# Patient Record
Sex: Male | Born: 1960
Health system: Southern US, Community
[De-identification: ages and names within clinical notes are randomized; demographics above are authoritative.]

## PROBLEM LIST (undated history)

## (undated) DIAGNOSIS — Z87448 Personal history of other diseases of urinary system: Secondary | ICD-10-CM

## (undated) DIAGNOSIS — K4090 Unilateral inguinal hernia, without obstruction or gangrene, not specified as recurrent: Secondary | ICD-10-CM

## (undated) DIAGNOSIS — E7800 Pure hypercholesterolemia, unspecified: Secondary | ICD-10-CM

## (undated) HISTORY — DX: Pure hypercholesterolemia, unspecified: E78.00

## (undated) HISTORY — DX: Personal history of other diseases of urinary system: Z87.448

## (undated) HISTORY — DX: Unilateral inguinal hernia, without obstruction or gangrene, not specified as recurrent: K40.90

---

## 1984-09-03 DIAGNOSIS — Z87448 Personal history of other diseases of urinary system: Secondary | ICD-10-CM

## 1984-09-03 HISTORY — DX: Personal history of other diseases of urinary system: Z87.448

## 2005-04-25 ENCOUNTER — Encounter: Admission: RE | Admit: 2005-04-25 | Discharge: 2005-04-25 | Payer: Self-pay | Admitting: General Surgery

## 2005-04-30 ENCOUNTER — Ambulatory Visit (HOSPITAL_BASED_OUTPATIENT_CLINIC_OR_DEPARTMENT_OTHER): Admission: RE | Admit: 2005-04-30 | Discharge: 2005-04-30 | Payer: Self-pay | Admitting: General Surgery

## 2005-04-30 ENCOUNTER — Ambulatory Visit (HOSPITAL_COMMUNITY): Admission: RE | Admit: 2005-04-30 | Discharge: 2005-04-30 | Payer: Self-pay | Admitting: General Surgery

## 2005-09-03 HISTORY — PX: LEG SKIN LESION  BIOPSY / EXCISION: SUR473

## 2012-09-03 DIAGNOSIS — K4090 Unilateral inguinal hernia, without obstruction or gangrene, not specified as recurrent: Secondary | ICD-10-CM

## 2012-09-03 HISTORY — DX: Unilateral inguinal hernia, without obstruction or gangrene, not specified as recurrent: K40.90

## 2012-11-21 ENCOUNTER — Encounter (INDEPENDENT_AMBULATORY_CARE_PROVIDER_SITE_OTHER): Payer: Self-pay | Admitting: General Surgery

## 2012-11-21 ENCOUNTER — Ambulatory Visit (INDEPENDENT_AMBULATORY_CARE_PROVIDER_SITE_OTHER): Payer: 59 | Admitting: General Surgery

## 2012-11-21 VITALS — BP 122/78 | HR 76 | Temp 97.3°F | Resp 16 | Ht 71.0 in | Wt 227.6 lb

## 2012-11-21 DIAGNOSIS — K4090 Unilateral inguinal hernia, without obstruction or gangrene, not specified as recurrent: Secondary | ICD-10-CM

## 2012-11-21 NOTE — Progress Notes (Signed)
Subjective:   hernia right groin  Patient ID: Jeremy Zimmerman, male   DOB: 01-31-1961, 52 y.o.   MRN: [id number]  HPI The patient is here for the courtesy of Dr. Tenny Craw for evaluation of a probable right inguinal hernia. The patient states he recently presented for routine physical exam and was found to have a right inguinal hernia. Since being told it was there he thought he might have had some pressure or mild discomfort in the area but thinks it may have just been related to worrying about it. He certainly has had no significant pain or associated GI symptoms. He has no previous history of hernia repairs. Past Medical History  Diagnosis Date  . Inguinal hernia without mention of obstruction or gangrene, unilateral or unspecified, (not specified as recurrent) 2014  . Pure hypercholesterolemia   . History of kidney problems 1986    Patient has history of non-specific kidney problems   Past Surgical History  Procedure Laterality Date  . Leg skin lesion  biopsy / excision  2007    Growth removed    No current outpatient prescriptions on file.   No current facility-administered medications for this visit.   History  Substance Use Topics  . Smoking status: Current Some Day Smoker    Types: Cigars  . Smokeless tobacco: Never Used  . Alcohol Use: 0.6 oz/week    1 Cans of beer per week     Review of Systems  Respiratory: Negative.   Cardiovascular: Negative.   Gastrointestinal: Negative.   Genitourinary: Negative.        Objective:   Physical Exam BP 122/78  Pulse 76  Temp(Src) 97.3 F (36.3 C) (Temporal)  Resp 16  Ht 5\' 11"  (1.803 m)  Wt 227 lb 9.6 oz (103.239 kg)  BMI 31.76 kg/m2 General: Mildly overweight but otherwise well-appearing African American male Skin: No rash infection HEENT: No palpable masses or thyromegaly. Sclera nonicteric. Lungs: Clear equal breath sounds bilaterally Cardiac: Regular rate and rhythm without murmur. No edema. Abdomen: Generally soft and  nontender. There is a moderate-sized probably direct right inguinal hernia. I cannot feel any hernia on the left. GU normal male without masses Extremities: No joint swelling or edema Neurologic: Alert and fully oriented. Gait normal.    Assessment:     Right inguinal hernia. This is minimally symptomatic. We discussed the nature of hernia and options for repair. We discussed laparoscopic repair including the indications and risks of general anesthesia, bleeding, infection, recurrence, chronic pain and possible need for conversion to an open procedure.. We discussed that with minimal symptoms I think he is options for watchful waiting versus going ahead with repair. After our discussion and after all his questions were answered he feels he would like to have this repaired in the summer.he will call us back in regards to scheduling date appear    Plan:     Laparoscopic repair of right inguinal hernia as an outpatient under general anesthesia.

## 2013-02-20 ENCOUNTER — Ambulatory Visit (INDEPENDENT_AMBULATORY_CARE_PROVIDER_SITE_OTHER): Payer: PRIVATE HEALTH INSURANCE | Admitting: General Surgery

## 2013-02-20 ENCOUNTER — Encounter (INDEPENDENT_AMBULATORY_CARE_PROVIDER_SITE_OTHER): Payer: Self-pay | Admitting: General Surgery

## 2013-02-20 VITALS — BP 124/72 | HR 70 | Temp 97.1°F | Resp 16 | Ht 71.0 in | Wt 222.6 lb

## 2013-02-20 DIAGNOSIS — K4090 Unilateral inguinal hernia, without obstruction or gangrene, not specified as recurrent: Secondary | ICD-10-CM

## 2013-02-20 NOTE — Patient Instructions (Signed)

## 2013-02-20 NOTE — Progress Notes (Signed)
Subjective:   hernia right groin  Patient ID: Jeremy Zimmerman, male   DOB: 1961/04/03, 52 y.o.   MRN: [id number]  HPI The patient was previously referred by Dr. Tenny Craw for evaluation of a probable right inguinal hernia. The patient had presented for routine physical exam and was found to have a right inguinal hernia. Since being told it was there he thought he might have had some pressure or mild discomfort in the area but thinks it may have just been related to worrying about it. He certainly has had no significant pain or associated GI symptoms. He has no previous history of hernia repairs. He was evaluated several months ago and wanted to put off for surgery for a little while. He returns today ready to get it repaired. He has occasional mild discomfort but no real change in his symptoms.  Past Medical History  Diagnosis Date  . Inguinal hernia without mention of obstruction or gangrene, unilateral or unspecified, (not specified as recurrent) 2014  . Pure hypercholesterolemia   . History of kidney problems 1986    Patient has history of non-specific kidney problems   Past Surgical History  Procedure Laterality Date  . Leg skin lesion  biopsy / excision  2007    Growth removed    Current Outpatient Prescriptions  Medication Sig Dispense Refill  . IBUPROFEN PO Take by mouth.       No current facility-administered medications for this visit.   History  Substance Use Topics  . Smoking status: Current Some Day Smoker    Types: Cigars  . Smokeless tobacco: Never Used  . Alcohol Use: 0.6 oz/week    1 Cans of beer per week     Review of Systems  Respiratory: Negative.   Cardiovascular: Negative.   Gastrointestinal: Negative.   Genitourinary: Negative.        Objective:   Physical Exam BP 124/72  Pulse 70  Temp(Src) 97.1 F (36.2 C) (Temporal)  Resp 16  Ht 5\' 11"  (1.803 m)  Wt 222 lb 9.6 oz (100.971 kg)  BMI 31.06 kg/m2 General: Mildly overweight but otherwise well-appearing  African American male Skin: No rash infection HEENT: No palpable masses or thyromegaly. Sclera nonicteric. Lungs: Clear equal breath sounds bilaterally Cardiac: Regular rate and rhythm without murmur. No edema. Abdomen: Generally soft and nontender. There is a moderate-sized probably direct right inguinal hernia. I cannot feel any hernia on the left. GU normal male without masses Extremities: No joint swelling or edema Neurologic: Alert and fully oriented. Gait normal.    Assessment:     Right inguinal hernia. This is minimally symptomatic. We discussed the nature of hernia and options for repair. We discussed laparoscopic repair including the indications and risks of general anesthesia, bleeding, infection, recurrence, chronic pain and possible need for conversion to an open procedure.Marland Kitchen He would like to go ahead and get this repaired which I think is the best course.     Plan:     Laparoscopic repair of right inguinal hernia as an outpatient under general anesthesia.

## 2013-03-03 ENCOUNTER — Telehealth (INDEPENDENT_AMBULATORY_CARE_PROVIDER_SITE_OTHER): Payer: Self-pay | Admitting: *Deleted

## 2013-03-03 NOTE — Telephone Encounter (Signed)
Patient called to ask about his Celebrex prior to surgery.  Instructed patient to stop 7 days prior to his surgery date 03/17/13.  Patient agreeable and states understanding.

## 2013-03-17 DIAGNOSIS — K4090 Unilateral inguinal hernia, without obstruction or gangrene, not specified as recurrent: Secondary | ICD-10-CM

## 2013-04-08 ENCOUNTER — Encounter (INDEPENDENT_AMBULATORY_CARE_PROVIDER_SITE_OTHER): Payer: Self-pay | Admitting: General Surgery

## 2013-04-08 ENCOUNTER — Ambulatory Visit (INDEPENDENT_AMBULATORY_CARE_PROVIDER_SITE_OTHER): Payer: PRIVATE HEALTH INSURANCE | Admitting: General Surgery

## 2013-04-08 DIAGNOSIS — Z09 Encounter for follow-up examination after completed treatment for conditions other than malignant neoplasm: Secondary | ICD-10-CM

## 2013-04-08 NOTE — Progress Notes (Signed)
History: Patient returns 3 weeks following laparoscopic repair of his right inguinal hernia. He reports he is doing well. Just minimal soreness at his umbilical incision. No other complaints.  Exam: There were no vitals taken for this visit. General: Appears well Abdomen: Incisions all well healed. There is a small palpable seroma at his right groin but no evidence of recurrent hernia with coughing and straining and Valsalva maneuver.  Assessment and plan: Doing well without complication. He has a small seroma but told him should resolve and he did not even noticed it. He is cleared to return to work next week and return here as needed.

## 2013-06-18 ENCOUNTER — Encounter: Payer: Self-pay | Admitting: Interventional Cardiology

## 2013-06-23 ENCOUNTER — Ambulatory Visit (INDEPENDENT_AMBULATORY_CARE_PROVIDER_SITE_OTHER): Payer: Managed Care, Other (non HMO) | Admitting: Interventional Cardiology

## 2013-06-23 ENCOUNTER — Encounter: Payer: Self-pay | Admitting: Interventional Cardiology

## 2013-06-23 VITALS — BP 140/75 | HR 61 | Ht 71.0 in | Wt 227.0 lb

## 2013-06-23 DIAGNOSIS — E785 Hyperlipidemia, unspecified: Secondary | ICD-10-CM

## 2013-06-23 DIAGNOSIS — R002 Palpitations: Secondary | ICD-10-CM | POA: Insufficient documentation

## 2013-06-23 DIAGNOSIS — R0789 Other chest pain: Secondary | ICD-10-CM

## 2013-06-23 NOTE — Patient Instructions (Signed)
Your physician has recommended that you wear a holter monitor. Holter monitors are medical devices that record the heart's electrical activity. Doctors most often use these monitors to diagnose arrhythmias. Arrhythmias are problems with the speed or rhythm of the heartbeat. The monitor is a small, portable device. You can wear one while you do your normal daily activities. This is usually used to diagnose what is causing palpitations/syncope (passing out).  Your physician has requested that you have an exercise tolerance test. For further information please visit [email or web address] Please also follow instruction sheet, as given.

## 2013-06-23 NOTE — Progress Notes (Signed)
Patient ID: Jeremy Zimmerman, male   DOB: 15-Aug-1961, 52 y.o.   MRN: [id number]   Date: 06/23/2013 ID: Jeremy Zimmerman, DOB [birth year], MRN [id number] PCP:  Duane Lope, MD  Reason: Chest discomfort  ASSESSMENT;  1. Chest tightness occurring in episodes lasting up to 15 minutes.  2. Palpitations  3. Hyperlipidemia   PLAN:  1. Exercise treadmill test to exclude coronary artery disease  2. 24-48 hour Holter monitor to exclude arrhythmia   SUBJECTIVE: Jeremy Zimmerman is a 52 y.o. male who is very pleasant gentleman who exercises frequently. He rarely smokes cigarettes. There is a 3 year history of poorly characterized chest discomfort in the lower mid sternal area that radiates to the back. These episodes start in the epigastric area and rise up into his chest. He also gets the sense that there is pounding or irregularity of the heartbeat. The longest episode has been 15 minutes. These episodes are unpredictable. They are nonexertional. The long episode caused him to feel diaphoretic and weak. He denies dyspnea. He has never had syncope. He has had approximately 10 episodes over 3 years.   No Known Allergies  Current Outpatient Prescriptions on File Prior to Visit  Medication Sig Dispense Refill  . IBUPROFEN PO Take by mouth.      . oxyCODONE-acetaminophen (PERCOCET/ROXICET) 5-325 MG per tablet        No current facility-administered medications on file prior to visit.    Past Medical History  Diagnosis Date  . Inguinal hernia without mention of obstruction or gangrene, unilateral or unspecified, (not specified as recurrent) 2014  . Pure hypercholesterolemia   . History of kidney problems 1986    Patient has history of non-specific kidney problems    Past Surgical History  Procedure Laterality Date  . Leg skin lesion  biopsy / excision  2007    Growth removed     History   Social History  . Marital Status: Married    Spouse Name: N/A    Number of Children: N/A  . Years of  Education: N/A   Occupational History  . Not on file.   Social History Main Topics  . Smoking status: Current Some Day Smoker    Types: Cigars  . Smokeless tobacco: Never Used  . Alcohol Use: 0.6 oz/week    1 Cans of beer per week  . Drug Use: No  . Sexual Activity: Not on file   Other Topics Concern  . Not on file   Social History Narrative  . No narrative on file    Family History  Problem Relation Age of Onset  . Hypertension Mother     ROS: Nausea, vomiting, syncope, tachycardia palpitations, transient neurological symptoms, edema, abdominal pain, wheezing, hemoptysis, weight loss, and anorexia. Other systems negative for complaints.  OBJECTIVE: BP 140/75  Pulse 61  Ht 5\' 11"  (1.803 m)  Wt 227 lb (102.967 kg)  BMI 31.67 kg/m2,  General: No acute distress, well-developed, well-nourished, in excellent physical condition HEENT: normal  Neck: JVD none. Carotids with no bruits and 2+ upstroke Chest: Clear Cardiac: Murmur: None. Gallop: S4. Rhythm: Regular. Other: None Abdomen: Bruit: None. Pulsation: Normal Extremities: Edema: None. Pulses: 2+ and symmetric bilaterally Neuro: Normal Psych: Normal  ECG: Normal

## 2013-07-08 ENCOUNTER — Encounter (INDEPENDENT_AMBULATORY_CARE_PROVIDER_SITE_OTHER): Payer: Managed Care, Other (non HMO)

## 2013-07-08 ENCOUNTER — Encounter: Payer: Self-pay | Admitting: *Deleted

## 2013-07-08 DIAGNOSIS — R002 Palpitations: Secondary | ICD-10-CM

## 2013-07-08 NOTE — Progress Notes (Signed)
Patient ID: Jeremy Zimmerman, male   DOB: [birth year], 52 y.o.   MRN: [id number] EVO 48 hour holter monitor applied to patient.

## 2013-07-27 ENCOUNTER — Telehealth: Payer: Self-pay

## 2013-07-27 NOTE — Telephone Encounter (Signed)
Pt given results of holter monitor.normal study.pt verbalized understanding.

## 2013-07-28 ENCOUNTER — Ambulatory Visit (INDEPENDENT_AMBULATORY_CARE_PROVIDER_SITE_OTHER): Payer: Managed Care, Other (non HMO) | Admitting: Interventional Cardiology

## 2013-07-28 DIAGNOSIS — R0789 Other chest pain: Secondary | ICD-10-CM

## 2013-07-28 NOTE — Progress Notes (Signed)
Exercise Treadmill Test  Pre-Exercise Testing Evaluation Rhythm: normal sinus  Rate: 78 bpm     Test  Exercise Tolerance Test Ordering MD: Verdis Prime, MD  Interpreting MD: Verdis Prime, MD  Unique Test No: 1  Treadmill:  1  Indication for ETT: chest tightness  Contraindication to ETT: No   Stress Modality: exercise - treadmill  Cardiac Imaging Performed: non   Protocol: standard Bruce - maximal  Max BP:  165/47   Max MPHR (bpm):  167 85% MPR (bpm):  142  MPHR obtained (bpm):  146  % MPHR obtained:  87%   Reached 85% MPHR (min:sec):  8 minutes  Total Exercise Time (min-sec):  9 minutes   Workload in METS: 10.4  Borg Scale: 17   Reason ETT Terminated:  Surpassed the target heart rate    ST Segment Analysis At Rest: normal ST segments - no evidence of significant ST depression With Exercise: no evidence of significant ST depression  Other Information Arrhythmia:  No Angina during ETT:  absent (0) Quality of ETT:  diagnostic  ETT Interpretation:  normal - no evidence of ischemia by ST analysis  Comments: The patient has better than average exertional tolerance and no evidence of ischemia.  Recommendations: No limitations. He should call if palpitations increase and become disruptive in his lifestyle.

## 2013-12-30 ENCOUNTER — Encounter: Payer: Self-pay | Admitting: Cardiovascular Disease

## 2018-09-08 DIAGNOSIS — I10 Essential (primary) hypertension: Secondary | ICD-10-CM | POA: Diagnosis not present

## 2018-09-08 DIAGNOSIS — T148XXA Other injury of unspecified body region, initial encounter: Secondary | ICD-10-CM | POA: Diagnosis not present

## 2018-09-12 DIAGNOSIS — E7800 Pure hypercholesterolemia, unspecified: Secondary | ICD-10-CM | POA: Diagnosis not present

## 2018-09-16 DIAGNOSIS — R030 Elevated blood-pressure reading, without diagnosis of hypertension: Secondary | ICD-10-CM | POA: Diagnosis not present

## 2018-11-14 DIAGNOSIS — Z0000 Encounter for general adult medical examination without abnormal findings: Secondary | ICD-10-CM | POA: Diagnosis not present

## 2018-12-24 DIAGNOSIS — M549 Dorsalgia, unspecified: Secondary | ICD-10-CM | POA: Diagnosis not present

## 2019-02-23 DIAGNOSIS — M545 Low back pain: Secondary | ICD-10-CM | POA: Diagnosis not present

## 2019-02-23 DIAGNOSIS — M5430 Sciatica, unspecified side: Secondary | ICD-10-CM | POA: Diagnosis not present

## 2019-03-02 DIAGNOSIS — M79659 Pain in unspecified thigh: Secondary | ICD-10-CM | POA: Diagnosis not present

## 2019-03-09 ENCOUNTER — Other Ambulatory Visit: Payer: Self-pay | Admitting: Family Medicine

## 2019-03-09 DIAGNOSIS — M5416 Radiculopathy, lumbar region: Secondary | ICD-10-CM | POA: Diagnosis not present

## 2019-03-09 DIAGNOSIS — M549 Dorsalgia, unspecified: Secondary | ICD-10-CM | POA: Diagnosis not present

## 2019-03-15 ENCOUNTER — Ambulatory Visit (INDEPENDENT_AMBULATORY_CARE_PROVIDER_SITE_OTHER): Payer: BC Managed Care – PPO

## 2019-03-15 ENCOUNTER — Other Ambulatory Visit: Payer: Self-pay

## 2019-03-15 DIAGNOSIS — M5416 Radiculopathy, lumbar region: Secondary | ICD-10-CM | POA: Diagnosis not present

## 2019-03-15 DIAGNOSIS — M545 Low back pain: Secondary | ICD-10-CM | POA: Diagnosis not present

## 2019-03-26 DIAGNOSIS — M5126 Other intervertebral disc displacement, lumbar region: Secondary | ICD-10-CM | POA: Diagnosis not present

## 2019-03-26 DIAGNOSIS — M545 Low back pain: Secondary | ICD-10-CM | POA: Diagnosis not present

## 2019-03-31 ENCOUNTER — Ambulatory Visit: Payer: BC Managed Care – PPO | Admitting: Physical Therapy

## 2019-07-01 DIAGNOSIS — M549 Dorsalgia, unspecified: Secondary | ICD-10-CM | POA: Diagnosis not present

## 2019-07-03 DIAGNOSIS — Z23 Encounter for immunization: Secondary | ICD-10-CM | POA: Diagnosis not present

## 2019-07-08 ENCOUNTER — Encounter: Payer: Self-pay | Admitting: Physical Therapy

## 2019-07-08 ENCOUNTER — Other Ambulatory Visit: Payer: Self-pay

## 2019-07-08 ENCOUNTER — Ambulatory Visit: Payer: BC Managed Care – PPO | Attending: Neurosurgery | Admitting: Physical Therapy

## 2019-07-08 DIAGNOSIS — M62830 Muscle spasm of back: Secondary | ICD-10-CM | POA: Diagnosis not present

## 2019-07-08 DIAGNOSIS — M5442 Lumbago with sciatica, left side: Secondary | ICD-10-CM

## 2019-07-08 NOTE — Therapy (Signed)
[facility] [address] [place], [place], [address] Phone: [phone]   Fax:  [phone]  Physical Therapy Evaluation  Patient Details  Name: Jeremy Zimmerman MRN: [id number] Date of Birth: August 20, 1961 Referring Provider (PT): Daiva Eves Date: 07/08/2019  PT End of Session - 07/08/19 1743    Visit Number  1    Date for PT Re-Evaluation  09/07/19    Authorization Type  [employer]    PT Start Time  1652    PT Stop Time  1740    PT Time Calculation (min)  48 min    Activity Tolerance  Patient tolerated treatment well    Behavior During Therapy  [facility] for tasks assessed/performed       Past Medical History:  Diagnosis Date  . History of kidney problems 1986   Patient has history of non-specific kidney problems  . Inguinal hernia without mention of obstruction or gangrene, unilateral or unspecified, (not specified as recurrent) 2014  . Pure hypercholesterolemia     Past Surgical History:  Procedure Laterality Date  . LEG SKIN LESION  BIOPSY / EXCISION  2007   Growth removed     There were no vitals filed for this visit.   Subjective Assessment - 07/08/19 1654    Subjective  Patient reports that in June he started having worse pain in the low back, he reports he has had pain previously but would normally go away.  That day he started having severe left anterior leg pain.  MRI showed HNP with disc impingement at the left L3-4 area.  His job is unloading pallets, he reports that he has not had any leg pain.The leg pain has gone away but he is still having back pain    Limitations  Lifting;Standing;Walking    Patient Stated Goals  have no pain, do job without difficulty    Currently in Pain?  Yes    Pain Score  5     Pain Location  Back    Pain Orientation  Lower    Pain Descriptors / Indicators  Tightness;Spasm;Aching    Pain Type  Acute pain    Pain Radiating Towards  has had pain in the left leg    Pain Onset   More than a month ago    Pain Frequency  Intermittent    Aggravating Factors   lifting, bending but mostly unsure of why, pain can be up to 8/10    Pain Relieving Factors  takes pain meds, one a day and he usually has pain a 2/10    Effect of Pain on Daily Activities  limits ADL's and movements due to pain         [facility] PT Assessment - 07/08/19 0001      Assessment   Medical Diagnosis  LBP with HNP    Referring Provider (PT)  Lovell Sheehan    Onset Date/Surgical Date  02/05/19    Prior Therapy  no      Precautions   Precautions  None      Balance Screen   Has the patient fallen in the past 6 months  No    Has the patient had a decrease in activity level because of a fear of falling?   No    Is the patient reluctant to leave their home because of a fear of falling?   No      Home Environment   Additional Comments  does yardwork  Prior Function   Level of Independence  Independent    Vocation  Full time employment    Vocation Requirements  normal job was unloading pallets lifting 40# and pulling and pushing, now he is doing more bending and smaller lifting    Leisure  was working out 4 days per week      Posture/Postural Control   Posture Comments  decreased lordosis, slouched posture      ROM / Strength   AROM / PROM / Strength  AROM;Strength      AROM   Overall AROM Comments  Lumbar ROM decreased 25% with c/o tightness      Strength   Overall Strength Comments  slight weakness in the left hip flexor compared to the right, left hip flexion 4+/5      Flexibility   Soft Tissue Assessment /Muscle Length  yes    Hamstrings  tight    Quadriceps  tight    ITB  tight    Piriformis  tight      Palpation   Palpation comment  has knots palpable, tight and some tenderness in the low back and into the buttocks      Special Tests   Other special tests  manual pelvic traction felt good                Objective measurements completed on examination: See above  findings.              PT Education - 07/08/19 1742    Education Details  HEP included standing extension, prone on elbows, sheet traction, HS and piriformis stretches    Person(s) Educated  Patient    Methods  Explanation;Demonstration;Tactile cues;Verbal cues;Handout    Comprehension  Verbalized understanding       PT Short Term Goals - 07/08/19 1747      PT SHORT TERM GOAL #1   Title  independent with initial HEP    Time  2    Period  Weeks    Status  New        PT Long Term Goals - 07/08/19 1747      PT LONG TERM GOAL #1   Title  understand posture and body mechanics    Time  8    Period  Weeks    Status  New      PT LONG TERM GOAL #2   Title  safe with return to a gym    Time  8    Period  Weeks    Status  New      PT LONG TERM GOAL #3   Title  decrease pain without pain meds 50%    Time  8    Period  Weeks    Status  New             Plan - 07/08/19 1744    Clinical Impression Statement  Patient reports that he has had some back pain at times from a fall years ago, he reports that in June he had increase of LBP this time with severe leg pain and causing him to have difficulty walking, MRI showed HNP with left L3-4 nerve impingement, he however report that the leg pain has stopped, he stiill has back pain and reports taking one pain pill a day.  Reports that pain comes and goes and theat there is not true cause. He is very tight  in the LE's, and needs education about posture and body mechanics  Stability/Clinical Decision Making  Stable/Uncomplicated    Clinical Decision Making  Low    Rehab Potential  Good    PT Frequency  1x / week    PT Duration  8 weeks    PT Treatment/Interventions  ADLs/Self Care Home Management;Electrical Stimulation;Moist Heat;Traction;Ultrasound;Manual techniques;Therapeutic exercise;Therapeutic activities;Patient/family education    PT Next Visit Plan  patient is to try HEP on his own, he may return after a week,  would then start education about body mechanics for his job and for gym, if he is in pain will treat pain    Consulted and Agree with Plan of Care  Patient       Patient will benefit from skilled therapeutic intervention in order to improve the following deficits and impairments:  Improper body mechanics, Pain, Postural dysfunction, Increased muscle spasms, Decreased range of motion, Decreased strength, Impaired flexibility  Visit Diagnosis: Acute bilateral low back pain with left-sided sciatica - Plan: PT plan of care cert/re-cert  Muscle spasm of back - Plan: PT plan of care cert/re-cert     Problem List Patient Active Problem List   Diagnosis Date Noted  . Chest tightness 06/23/2013    Class: Acute  . Other and unspecified hyperlipidemia 06/23/2013    Class: Chronic  . Palpitations 06/23/2013    Class: Chronic    Jearld LeschALBRIGHT,MICHAEL W., PT 07/08/2019, 5:50 PM  [facility] [address] [place], [place], [address] Phone: [phone]   Fax:  [phone]  Name: Jeremy Zimmerman MRN: [id number] Date of Birth: 1960-10-13

## 2019-07-28 ENCOUNTER — Ambulatory Visit: Payer: BC Managed Care – PPO | Admitting: Physical Therapy

## 2019-11-13 DIAGNOSIS — S29011A Strain of muscle and tendon of front wall of thorax, initial encounter: Secondary | ICD-10-CM | POA: Diagnosis not present

## 2019-12-16 DIAGNOSIS — Z125 Encounter for screening for malignant neoplasm of prostate: Secondary | ICD-10-CM | POA: Diagnosis not present

## 2019-12-16 DIAGNOSIS — Z13220 Encounter for screening for lipoid disorders: Secondary | ICD-10-CM | POA: Diagnosis not present

## 2019-12-16 DIAGNOSIS — H6121 Impacted cerumen, right ear: Secondary | ICD-10-CM | POA: Diagnosis not present

## 2019-12-16 DIAGNOSIS — Z1159 Encounter for screening for other viral diseases: Secondary | ICD-10-CM | POA: Diagnosis not present

## 2019-12-16 DIAGNOSIS — Z0000 Encounter for general adult medical examination without abnormal findings: Secondary | ICD-10-CM | POA: Diagnosis not present

## 2020-01-19 DIAGNOSIS — R079 Chest pain, unspecified: Secondary | ICD-10-CM | POA: Diagnosis not present

## 2020-01-19 DIAGNOSIS — R946 Abnormal results of thyroid function studies: Secondary | ICD-10-CM | POA: Diagnosis not present

## 2020-01-19 DIAGNOSIS — M549 Dorsalgia, unspecified: Secondary | ICD-10-CM | POA: Diagnosis not present

## 2020-01-20 ENCOUNTER — Other Ambulatory Visit: Payer: Self-pay | Admitting: Family Medicine

## 2020-01-20 DIAGNOSIS — R946 Abnormal results of thyroid function studies: Secondary | ICD-10-CM

## 2020-01-28 NOTE — Progress Notes (Signed)
Cardiology Office Note:   Date:  01/29/2020  NAME:  Jeremy Zimmerman    MRN: [id number] DOB:  1961/02/17   PCP:  Jeremy Cruel, MD  Cardiologist:  No primary care provider on file.   Referring MD: Jeremy Cruel, MD   Chief Complaint  Patient presents with  . Chest Pain   History of Present Illness:   Jeremy Zimmerman is a 59 y.o. male with a hx of HLD who is being seen today for the evaluation of chest pain at the request of Jeremy Cruel, MD.  He presents for evaluation of chest pain.  He reports he got his second Covid shot on December 28, 2019.  Since that time he had intermittent episodes of neck pain as well as headache.  He is also what he describes as a discomfort in his chest.  He reports it feels like dull achy pain.  He reports at its worst 6 out of 10.  He has had 3-4 episodes over the past 1 month.  They can occur at any time.  They last 10 to 15 minutes.  Associated symptoms include shortness of breath.  It does bother him as he is concerned about heart disease runs in the family.  His brother had a heart attack as well as his grandmother.  His CVD risk factors include obesity.  His BMI is 34.  He does not have high blood pressure.  Cholesterol reviewed below.  It is marginal.  He does not perform aerobic exercise routinely but does do weightlifting.  He reports he can complete 20 to 30 minutes without any major limitations such as chest pain or shortness of breath.  He is a former smoker.  He does smoke cigars infrequently.  He also reports some marijuana use.  He rarely drinks alcohol.  Of note, he does report that naproxen has helped.  He has taken this intermittently and the symptoms have resolved.  His primary care physician believes this is related to the Covid vaccine.  He does report in the past that he has had episodes of chest pain that was evaluated in 2015 by Dr. Pernell Zimmerman.  He did undergo an exercise treadmill stress test that appears to have been normal.  His EKG  today in office shows normal sinus rhythm with no acute ischemic changes or evidence of prior infarction.  Labs from primary care physician demonstrate total cholesterol 206, triglycerides 58, HDL 69, LDL 126, serum creatinine 1.09  Past Medical History: Past Medical History:  Diagnosis Date  . History of kidney problems 1986   Patient has history of non-specific kidney problems  . Inguinal hernia without mention of obstruction or gangrene, unilateral or unspecified, (not specified as recurrent) 2014  . Pure hypercholesterolemia     Past Surgical History: Past Surgical History:  Procedure Laterality Date  . LEG SKIN LESION  BIOPSY / EXCISION  2007   Growth removed     Current Medications: No outpatient medications have been marked as taking for the 01/29/20 encounter (Office Visit) with Jeremy Rile, MD.     Allergies:    Patient has no known allergies.   Social History: Social History   Socioeconomic History  . Marital status: Married    Spouse name: Not on file  . Number of children: 3  . Years of education: Not on file  . Highest education level: Not on file  Occupational History  . Not on file  Tobacco Use  . Smoking status: Current  Some Day Smoker    Types: Cigars  . Smokeless tobacco: Never Used  Substance and Sexual Activity  . Alcohol use: Yes    Alcohol/week: 1.0 standard drinks    Types: 1 Cans of beer per week  . Drug use: Yes    Types: Marijuana  . Sexual activity: Not on file  Other Topics Concern  . Not on file  Social History Narrative  . Not on file   Social Determinants of Health   Financial Resource Strain:   . Difficulty of Paying Living Expenses:   Food Insecurity:   . Worried About Charity fundraiser in the Last Year:   . Arboriculturist in the Last Year:   Transportation Needs:   . Film/video editor (Medical):   Marland Kitchen Lack of Transportation (Non-Medical):   Physical Activity:   . Days of Exercise per Week:   . Minutes  of Exercise per Session:   Stress:   . Feeling of Stress :   Social Connections:   . Frequency of Communication with Friends and Family:   . Frequency of Social Gatherings with Friends and Family:   . Attends Religious Services:   . Active Member of Clubs or Organizations:   . Attends Archivist Meetings:   Marland Kitchen Marital Status:      Family History: The patient's family history includes Heart attack in his brother and maternal grandmother; Hypertension in his mother; Stroke in his sister.  ROS:   All other ROS reviewed and negative. Pertinent positives noted in the HPI.     EKGs/Labs/Other Studies Reviewed:   The following studies were personally reviewed by me today:  EKG:  EKG is ordered today.  The ekg ordered today demonstrates normal sinus rhythm, heart rate 77, no acute ST-T changes, no evidence for infarction, and was personally reviewed by me.   Recent Labs: No results found for requested labs within last 8760 hours.   Recent Lipid Panel No results found for: CHOL, TRIG, HDL, CHOLHDL, VLDL, LDLCALC, LDLDIRECT  Physical Exam:   VS:  BP 130/84   Pulse 77   Ht 5' 11"  (1.803 m)   Wt 242 lb 12.8 oz (110.1 kg)   SpO2 94%   BMI 33.86 kg/m    Wt Readings from Last 3 Encounters:  01/29/20 242 lb 12.8 oz (110.1 kg)  06/23/13 227 lb (103 kg)  02/20/13 222 lb 9.6 oz (101 kg)    General: Well nourished, well developed, in no acute distress Heart: Atraumatic, normal size  Eyes: PEERLA, EOMI  Neck: Supple, no JVD Endocrine: No thryomegaly Cardiac: Normal S1, S2; RRR; no murmurs, rubs, or gallops Lungs: Clear to auscultation bilaterally, no wheezing, rhonchi or rales  Abd: Soft, nontender, no hepatomegaly  Ext: No edema, pulses 2+ Musculoskeletal: No deformities, BUE and BLE strength normal and equal Skin: Warm and dry, no rashes   Neuro: Alert and oriented to person, place, time, and situation, CNII-XII grossly intact, no focal deficits  Psych: Normal mood and  affect   ASSESSMENT:   Jeremy Zimmerman is a 59 y.o. male who presents for the following: 1. Chest pain, unspecified type   2. Obesity (BMI 30-39.9)     PLAN:   1. Chest pain, unspecified type 2. Obesity (BMI 30-39.9) -Atypical chest pain.  Could be musculoskeletal as it improved with naproxen.  He does have CVD risk factors including cigar use as well as marijuana use.  He also is obese with a BMI of  83.  He has a significant family history.  His EKG is without acute ischemic change or evidence of prior infarction.  Given the circumstances I recommended a coronary CTA.  We will obtain a BMP as well as a TSH today.  We will give him 100 mg metoprolol tartrate to our before the scan.  I will follow-up the results with him in 3 months.   Disposition: Return in about 3 months (around 04/30/2020).  Medication Adjustments/Labs and Tests Ordered: Current medicines are reviewed at length with the patient today.  Concerns regarding medicines are outlined above.  Orders Placed This Encounter  Procedures  . CT CORONARY MORPH W/CTA COR W/SCORE W/CA W/CM &/OR WO/CM  . CT CORONARY FRACTIONAL FLOW RESERVE DATA PREP  . CT CORONARY FRACTIONAL FLOW RESERVE FLUID ANALYSIS  . Basic metabolic panel  . TSH  . EKG 12-Lead   Meds ordered this encounter  Medications  . metoprolol tartrate (LOPRESSOR) 100 MG tablet    Sig: Take 1 tablet by mouth once for procedure.    Dispense:  1 tablet    Refill:  0    Patient Instructions  Medication Instructions:  Take Metoprolol 100 mg two hours before CT when scheduled.  *If you need a refill on your cardiac medications before your next appointment, please call your pharmacy*   Lab Work: BMET, TSH   If you have labs (blood work) drawn today and your tests are completely normal, you will receive your results only by: Marland Kitchen MyChart Message (if you have MyChart) OR . A paper copy in the mail If you have any lab test that is abnormal or we need to change your  treatment, we will call you to review the results.   Testing/Procedures: Your physician has requested that you have cardiac CT. Cardiac computed tomography (CT) is a painless test that uses an x-ray machine to take clear, detailed pictures of your heart. For further information please visit HugeFiesta.[email or web address]. Please follow instruction sheet as given.   Follow-Up: At [facility], you and your health needs are our priority.  As part of our continuing mission to provide you with exceptional heart care, we have created designated Provider Care Teams.  These Care Teams include your primary Cardiologist (physician) and Advanced Practice Providers (APPs -  Physician Assistants and Nurse Practitioners) who all work together to provide you with the care you need, when you need it.  We recommend signing up for the patient portal called "MyChart".  Sign up information is provided on this After Visit Summary.  MyChart is used to connect with patients for Virtual Visits (Telemedicine).  Patients are able to view lab/test results, encounter notes, upcoming appointments, etc.  Non-urgent messages can be sent to your provider as well.   To learn more about what you can do with MyChart, go to [email or web address].    Your next appointment:   3 month(s)  The format for your next appointment:   In Person  Provider:   Eleonore Chiquito, MD   Other Instructions Your cardiac CT will be scheduled at one of the below locations:   [facility] [address] [place], [place] [address] [phone]  [place] [address] [place], [place] [address] [phone]  If scheduled at [facility], please arrive at the [facility] main entrance of [facility] 30 minutes prior to test start time. Proceed to the [facility] Radiology Department (first floor) to check-in and  test prep.  If scheduled at [facility], please arrive 15 mins early for check-in and test prep.  Please follow these instructions carefully (unless otherwise directed):  Hold all erectile dysfunction medications at least 3 days (72 hrs) prior to test.  On the Night Before the Test: . Be sure to Drink plenty of water. . Do not consume any caffeinated/decaffeinated beverages or chocolate 12 hours prior to your test. . Do not take any antihistamines 12 hours prior to your test. . If you take Metformin do not take 24 hours prior to test.   On the Day of the Test: . Drink plenty of water. Do not drink any water within one hour of the test. . Do not eat any food 4 hours prior to the test. . You may take your regular medications prior to the test.  . Take metoprolol (Lopressor) two hours prior to test. . HOLD Furosemide/Hydrochlorothiazide morning of the test. . FEMALES- please wear underwire-free bra if available       After the Test: . Drink plenty of water. . After receiving IV contrast, you may experience a mild flushed feeling. This is normal. . On occasion, you may experience a mild rash up to 24 hours after the test. This is not dangerous. If this occurs, you can take Benadryl 25 mg and increase your fluid intake. . If you experience trouble breathing, this can be serious. If it is severe call 911 IMMEDIATELY. If it is mild, please call our office. . If you take any of these medications: Glipizide/Metformin, Avandament, Glucavance, please do not take 48 hours after completing test unless otherwise instructed.   Once we have confirmed authorization from your insurance company, we will call you to set up a date and time for your test.   For non-scheduling related questions, please contact the cardiac imaging nurse navigator should you have any questions/concerns: Marchia Bond, Cardiac Imaging Nurse Navigator Burley Saver, Interim Cardiac Imaging Nurse [place] and Vascular Services Direct  Office Dial: [phone]   For scheduling needs, including cancellations and rescheduling, please call [phone].        Signed, Addison Naegeli. Audie Box, [place]  [address] [place], [place] [address] [phone]  01/29/2020 8:44 AM

## 2020-01-29 ENCOUNTER — Ambulatory Visit: Payer: BC Managed Care – PPO | Admitting: Cardiovascular Disease

## 2020-01-29 ENCOUNTER — Encounter: Payer: Self-pay | Admitting: Cardiovascular Disease

## 2020-01-29 ENCOUNTER — Encounter (INDEPENDENT_AMBULATORY_CARE_PROVIDER_SITE_OTHER): Payer: Self-pay

## 2020-01-29 ENCOUNTER — Other Ambulatory Visit: Payer: Self-pay

## 2020-01-29 VITALS — BP 130/84 | HR 77 | Ht 71.0 in | Wt 242.8 lb

## 2020-01-29 DIAGNOSIS — R079 Chest pain, unspecified: Secondary | ICD-10-CM | POA: Diagnosis not present

## 2020-01-29 DIAGNOSIS — E669 Obesity, unspecified: Secondary | ICD-10-CM | POA: Diagnosis not present

## 2020-01-29 LAB — BASIC METABOLIC PANEL
BUN/Creatinine Ratio: 15 (ref 10–24)
BUN: 15 mg/dL (ref 8–27)
CO2: 23 mmol/L (ref 20–29)
Calcium: 9.1 mg/dL (ref 8.6–10.2)
Chloride: 100 mmol/L (ref 96–106)
Creatinine, Ser: 0.99 mg/dL (ref 0.76–1.27)
GFR calc Af Amer: 95 mL/min/{1.73_m2} (ref >59)
GFR calc non Af Amer: 82 mL/min/{1.73_m2} (ref >59)
Glucose: 86 mg/dL (ref 65–99)
Potassium: 4 mmol/L (ref 3.5–5.2)
Sodium: 137 mmol/L (ref 134–144)

## 2020-01-29 LAB — TSH: TSH: 1.19 u[IU]/mL (ref 0.450–4.500)

## 2020-01-29 MED ORDER — METOPROLOL TARTRATE 100 MG PO TABS: ORAL_TABLET | ORAL | 0 refills | Status: DC

## 2020-01-29 NOTE — Patient Instructions (Signed)
Medication Instructions:  Take Metoprolol 100 mg two hours before CT when scheduled.  *If you need a refill on your cardiac medications before your next appointment, please call your pharmacy*   Lab Work: BMET, TSH   If you have labs (blood work) drawn today and your tests are completely normal, you will receive your results only by: Marland Kitchen MyChart Message (if you have MyChart) OR . A paper copy in the mail If you have any lab test that is abnormal or we need to change your treatment, we will call you to review the results.   Testing/Procedures: Your physician has requested that you have cardiac CT. Cardiac computed tomography (CT) is a painless test that uses an x-ray machine to take clear, detailed pictures of your heart. For further information please visit HugeFiesta.[email or web address]. Please follow instruction sheet as given.   Follow-Up: At [facility], you and your health needs are our priority.  As part of our continuing mission to provide you with exceptional heart care, we have created designated Provider Care Teams.  These Care Teams include your primary Cardiologist (physician) and Advanced Practice Providers (APPs -  Physician Assistants and Nurse Practitioners) who all work together to provide you with the care you need, when you need it.  We recommend signing up for the patient portal called "MyChart".  Sign up information is provided on this After Visit Summary.  MyChart is used to connect with patients for Virtual Visits (Telemedicine).  Patients are able to view lab/test results, encounter notes, upcoming appointments, etc.  Non-urgent messages can be sent to your provider as well.   To learn more about what you can do with MyChart, go to [email or web address].    Your next appointment:   3 month(s)  The format for your next appointment:   In Person  Provider:   Eleonore Chiquito, MD   Other Instructions Your cardiac CT will be scheduled at one of the below locations:    [facility] [address] [place], [place] [address] [phone]  [place] [address] [place],  [address] [phone]  If scheduled at [facility], please arrive at the [facility] main entrance of [facility] 30 minutes prior to test start time. Proceed to the [facility] Radiology Department (first floor) to check-in and test prep.  If scheduled at [facility], please arrive 15 mins early for check-in and test prep.  Please follow these instructions carefully (unless otherwise directed):  Hold all erectile dysfunction medications at least 3 days (72 hrs) prior to test.  On the Night Before the Test: . Be sure to Drink plenty of water. . Do not consume any caffeinated/decaffeinated beverages or chocolate 12 hours prior to your test. . Do not take any antihistamines 12 hours prior to your test. . If you take Metformin do not take 24 hours prior to test.   On the Day of the Test: . Drink plenty of water. Do not drink any water within one hour of the test. . Do not eat any food 4 hours prior to the test. . You may take your regular medications prior to the test.  . Take metoprolol (Lopressor) two hours prior to test. . HOLD Furosemide/Hydrochlorothiazide morning of the test. . FEMALES- please wear underwire-free bra if available       After the Test: . Drink plenty of water. . After receiving IV contrast, you may experience a mild flushed  feeling. This is normal. . On occasion, you may experience a mild rash up to 24 hours after the test. This is not dangerous. If this occurs, you can take Benadryl 25 mg and increase your fluid intake. . If you experience trouble breathing, this can be serious. If it is severe call 911 IMMEDIATELY. If it is mild, please call our office. . If you take any of these medications: Glipizide/Metformin, Avandament, Glucavance,  please do not take 48 hours after completing test unless otherwise instructed.   Once we have confirmed authorization from your insurance company, we will call you to set up a date and time for your test.   For non-scheduling related questions, please contact the cardiac imaging nurse navigator should you have any questions/concerns: Marchia Bond, Cardiac Imaging Nurse Navigator Burley Saver, Interim Cardiac Imaging Nurse [place] and Vascular Services Direct Office Dial: [phone]   For scheduling needs, including cancellations and rescheduling, please call [phone].

## 2020-03-01 ENCOUNTER — Ambulatory Visit
Admission: RE | Admit: 2020-03-01 | Discharge: 2020-03-01 | Disposition: A | Discharge status: Home / Self Care | Payer: BC Managed Care – PPO | Source: Ambulatory Visit | Attending: Family Medicine | Admitting: Family Medicine

## 2020-03-01 DIAGNOSIS — E041 Nontoxic single thyroid nodule: Secondary | ICD-10-CM | POA: Diagnosis not present

## 2020-03-01 DIAGNOSIS — R946 Abnormal results of thyroid function studies: Secondary | ICD-10-CM

## 2020-03-24 ENCOUNTER — Other Ambulatory Visit: Payer: Self-pay | Admitting: Student

## 2020-04-07 ENCOUNTER — Telehealth: Payer: Self-pay

## 2020-04-07 ENCOUNTER — Other Ambulatory Visit: Payer: Self-pay

## 2020-04-07 DIAGNOSIS — R079 Chest pain, unspecified: Secondary | ICD-10-CM

## 2020-04-07 NOTE — Telephone Encounter (Signed)
Called patient- blood work ordered- he will come tomorrow to have completed for CT.

## 2020-04-07 NOTE — Telephone Encounter (Signed)
-----   Message from Marylynn Pearson sent at 04/01/2020  7:47 AM EDT ----- Regarding: Ct heart   Patient is scheduled on 8/16 @ 10am.  He will need labs  Thanks, Sheralyn Boatman

## 2020-04-08 ENCOUNTER — Other Ambulatory Visit: Payer: Self-pay

## 2020-04-08 DIAGNOSIS — R079 Chest pain, unspecified: Secondary | ICD-10-CM

## 2020-04-09 LAB — BASIC METABOLIC PANEL
BUN/Creatinine Ratio: 25 — ABNORMAL HIGH (ref 10–24)
BUN: 26 mg/dL (ref 8–27)
CO2: 24 mmol/L (ref 20–29)
Calcium: 9.4 mg/dL (ref 8.6–10.2)
Chloride: 106 mmol/L (ref 96–106)
Creatinine, Ser: 1.02 mg/dL (ref 0.76–1.27)
GFR calc Af Amer: 92 mL/min/{1.73_m2} (ref >59)
GFR calc non Af Amer: 80 mL/min/{1.73_m2} (ref >59)
Glucose: 77 mg/dL (ref 65–99)
Potassium: 4.2 mmol/L (ref 3.5–5.2)
Sodium: 143 mmol/L (ref 134–144)

## 2020-04-15 ENCOUNTER — Telehealth (HOSPITAL_COMMUNITY): Payer: Self-pay | Admitting: Emergency Medicine

## 2020-04-15 ENCOUNTER — Telehealth (HOSPITAL_COMMUNITY): Payer: Self-pay | Admitting: *Deleted

## 2020-04-15 NOTE — Telephone Encounter (Signed)
Attempting to reach patient for upcoming appt. Pt unavailable.  Rockwell Alexandria RN Navigator Cardiac Imaging [facility] Heart and Vascular Services [phone] Office  [phone] Cell

## 2020-04-15 NOTE — Telephone Encounter (Signed)
Attempted to call patient regarding upcoming cardiac CT appointment. Left message on voicemail with name and callback number  Finneas Mathe Tai RN Navigator Cardiac Imaging [place] [facility] [phone] Office [phone] Cell 

## 2020-04-18 ENCOUNTER — Ambulatory Visit (HOSPITAL_COMMUNITY)
Admission: RE | Admit: 2020-04-18 | Discharge: 2020-04-18 | Disposition: A | Discharge status: Home / Self Care | Payer: BC Managed Care – PPO | Source: Ambulatory Visit | Attending: Cardiovascular Disease | Admitting: Cardiovascular Disease

## 2020-04-18 ENCOUNTER — Encounter: Payer: BC Managed Care – PPO | Admitting: *Deleted

## 2020-04-18 ENCOUNTER — Other Ambulatory Visit: Payer: Self-pay

## 2020-04-18 ENCOUNTER — Encounter (HOSPITAL_COMMUNITY): Payer: Self-pay

## 2020-04-18 DIAGNOSIS — Z006 Encounter for examination for normal comparison and control in clinical research program: Secondary | ICD-10-CM

## 2020-04-18 DIAGNOSIS — R079 Chest pain, unspecified: Secondary | ICD-10-CM

## 2020-04-18 MED ORDER — NITROGLYCERIN 0.4 MG SL SUBL
SUBLINGUAL_TABLET | SUBLINGUAL | Status: AC
  Filled 2020-04-18: qty 2

## 2020-04-18 MED ORDER — METOPROLOL TARTRATE 5 MG/5ML IV SOLN
5.0000 mg | INTRAVENOUS | Status: DC | PRN
  Administered 2020-04-18: 5 mg via INTRAVENOUS

## 2020-04-18 MED ORDER — NITROGLYCERIN 0.4 MG SL SUBL
0.8000 mg | SUBLINGUAL_TABLET | Freq: Once | SUBLINGUAL | Status: AC
  Administered 2020-04-18: 0.8 mg via SUBLINGUAL

## 2020-04-18 MED ORDER — METOPROLOL TARTRATE 5 MG/5ML IV SOLN
INTRAVENOUS | Status: AC
  Filled 2020-04-18: qty 5

## 2020-04-18 MED ORDER — IOHEXOL 350 MG/ML SOLN
80.0000 mL | Freq: Once | INTRAVENOUS | Status: AC | PRN
  Administered 2020-04-18: 80 mL via INTRAVENOUS

## 2020-04-18 NOTE — Research (Signed)
CADFEM Informed Consent                  Subject Name:   Jeremy Zimmerman   Subject met inclusion and exclusion criteria.  The informed consent form, study requirements and expectations were reviewed with the subject and questions and concerns were addressed prior to the signing of the consent form.  The subject verbalized understanding of the trial requirements.  The subject agreed to participate in the CADFEM trial and signed the informed consent.  The informed consent was obtained prior to performance of any protocol-specific procedures for the subject.  A copy of the signed informed consent was given to the subject and a copy was placed in the subject's medical record.   [place] Chanee Henrickson, Research Assistant  04/18/2020 09:10 a.m.

## 2020-04-28 ENCOUNTER — Ambulatory Visit: Payer: BC Managed Care – PPO | Admitting: Cardiovascular Disease

## 2020-06-06 DIAGNOSIS — Q245 Malformation of coronary vessels: Secondary | ICD-10-CM | POA: Insufficient documentation

## 2020-06-06 NOTE — Progress Notes (Signed)
Cardiology Office Note:   Date:  06/09/2020  NAME:  Jeremy Zimmerman    MRN: [id number] DOB:  [birth year]   PCP:  Daisy Floro, MD  Cardiologist:  No primary care provider on file.   Referring MD: Daisy Floro, MD   Chief Complaint  Patient presents with  . Follow-up   History of Present Illness:   Jeremy Zimmerman is a 59 y.o. male with a hx of HLD who presents for follow-up. CCTA with normal coronary arteries, but anomalous RCA off the LCC.  He reports has had infrequent episodes of epigastric pain since our visit.  Response to Tums.  Had 1 episode 3 weeks ago after eating.  Took Tums and the pain went away.  Had an episode before that 3 months ago.  Blood pressure a bit elevated today 150/90.  In the past has been within normal limits.  He reports some stress.  Reports he has a very physical job.  Denies any exertional chest pain or pressure.  No shortness of breath either.  Total cholesterol 206, HDL 69, LDL 126, triglycerides 58.  BMI 33.  He denies chest pain, shortness of breath or palpitations in the office today.  We did discuss the results of his chest showing anomalous coronary artery.  At his age I would not recommend any treatment.  He has no symptoms of this.  Problem List 1. HLD 2. Anomalous RCA off the LCC -CT FFR 0.88 -no plaque, CAC score = 0  Past Medical History: Past Medical History:  Diagnosis Date  . History of kidney problems 1986   Patient has history of non-specific kidney problems  . Inguinal hernia without mention of obstruction or gangrene, unilateral or unspecified, (not specified as recurrent) 2014  . Pure hypercholesterolemia     Past Surgical History: Past Surgical History:  Procedure Laterality Date  . LEG SKIN LESION  BIOPSY / EXCISION  2007   Growth removed     Current Medications: Current Meds  Medication Sig  . IBUPROFEN PO Take by mouth.  . naproxen (NAPROSYN) 500 MG tablet Take 500 mg by mouth 2 (two) times daily as needed.  .  [DISCONTINUED] metoprolol tartrate (LOPRESSOR) 100 MG tablet Take 1 tablet by mouth once for procedure.     Allergies:    Patient has no known allergies.   Social History: Social History   Socioeconomic History  . Marital status: Married    Spouse name: Not on file  . Number of children: 3  . Years of education: Not on file  . Highest education level: Not on file  Occupational History  . Not on file  Tobacco Use  . Smoking status: Current Some Day Smoker    Types: Cigars  . Smokeless tobacco: Never Used  Substance and Sexual Activity  . Alcohol use: Yes    Alcohol/week: 1.0 standard drink    Types: 1 Cans of beer per week  . Drug use: Yes    Types: Marijuana  . Sexual activity: Not on file  Other Topics Concern  . Not on file  Social History Narrative  . Not on file   Social Determinants of Health   Financial Resource Strain:   . Difficulty of Paying Living Expenses: Not on file  Food Insecurity:   . Worried About Programme researcher, broadcasting/film/video in the Last Year: Not on file  . Ran Out of Food in the Last Year: Not on file  Transportation Needs:   . Lack of Transportation (  Medical): Not on file  . Lack of Transportation (Non-Medical): Not on file  Physical Activity:   . Days of Exercise per Week: Not on file  . Minutes of Exercise per Session: Not on file  Stress:   . Feeling of Stress : Not on file  Social Connections:   . Frequency of Communication with Friends and Family: Not on file  . Frequency of Social Gatherings with Friends and Family: Not on file  . Attends Religious Services: Not on file  . Active Member of Clubs or Organizations: Not on file  . Attends Banker Meetings: Not on file  . Marital Status: Not on file     Family History: The patient's family history includes Heart attack in his brother and maternal grandmother; Hypertension in his mother; Stroke in his sister.  ROS:   All other ROS reviewed and negative. Pertinent positives noted in  the HPI.     EKGs/Labs/Other Studies Reviewed:   The following studies were personally reviewed by me today:  CCTA 04/18/2020 1. Coronary calcium score of 0.  2. Anomalous right coronary artery off the left coronary cusp with slit-like orifice and interarterial course between the aorta and pulmonary artery.  3. No evidence of coronary artery disease. Stair step artifact noted but this appears to be a normal study regarding CAD assessment.  CT FFR 04/18/2020 1. Left Main: 0.99; no significant stenosis.  2. LAD: 0.96; no significant stenosis. 3. LCX: 0.97; no significant stenosis. 4. RCA: 0.88; no significant stenosis.  Recent Labs: 01/29/2020: TSH 1.190 04/08/2020: BUN 26; Creatinine, Ser 1.02; Potassium 4.2; Sodium 143   Recent Lipid Panel No results found for: CHOL, TRIG, HDL, CHOLHDL, VLDL, LDLCALC, LDLDIRECT  Physical Exam:   VS:  BP (!) 150/90   Pulse 78   Temp (!) 96.4 F (35.8 C)   Ht 5\' 11"  (1.803 m)   Wt 241 lb (109.3 kg)   SpO2 93%   BMI 33.61 kg/m    Wt Readings from Last 3 Encounters:  06/09/20 241 lb (109.3 kg)  01/29/20 242 lb 12.8 oz (110.1 kg)  06/23/13 227 lb (103 kg)    General: Well nourished, well developed, in no acute distress Heart: Atraumatic, normal size  Eyes: PEERLA, EOMI  Neck: Supple, no JVD Endocrine: No thryomegaly Cardiac: Normal S1, S2; RRR; no murmurs, rubs, or gallops Lungs: Clear to auscultation bilaterally, no wheezing, rhonchi or rales  Abd: Soft, nontender, no hepatomegaly  Ext: No edema, pulses 2+ Musculoskeletal: No deformities, BUE and BLE strength normal and equal Skin: Warm and dry, no rashes   Neuro: Alert and oriented to person, place, time, and situation, CNII-XII grossly intact, no focal deficits  Psych: Normal mood and affect   ASSESSMENT:   Jeremy Zimmerman is a 59 y.o. male who presents for the following: 1. 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 Mixed  hyperlipidemia   3. Gastroesophageal reflux disease without esophagitis     PLAN:   1. 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  He has had this his entire life.  His symptoms are inconsistent with ischemia.  He has no ischemia on his CT scan.  Due to his age of 29 I recommended no further treatment of this.  We will just monitor him yearly.  I highly anticipate this will not be an issue for him.  He will let 67 know if  he has any issues.  2. Mixed hyperlipidemia -Most recent LDL cholesterol 126.  Given his coronary calcium score of 0 no evidence of plaque I would recommend continued diet and exercise for now.  3. Gastroesophageal reflux disease without esophagitis -Symptoms of chest pain are consistent with acid reflux.  Response to Tums.  He will continue this for now.   Disposition: Return in about 1 year (around 06/09/2021).  Medication Adjustments/Labs and Tests Ordered: Current medicines are reviewed at length with the patient today.  Concerns regarding medicines are outlined above.  No orders of the defined types were placed in this encounter.  No orders of the defined types were placed in this encounter.   Patient Instructions  Medication Instructions:  No Changes In Medications at this time.  *If you need a refill on your cardiac medications before your next appointment, please call your pharmacy*  Lab Work: None Ordered At This Time.  If you have labs (blood work) drawn today and your tests are completely normal, you will receive your results only by: Marland Kitchen MyChart Message (if you have MyChart) OR . A paper copy in the mail If you have any lab test that is abnormal or we need to change your treatment, we will call you to review the results.  Testing/Procedures: None Ordered At This Time.   Follow-Up: At [facility], you and your health needs are our priority.  As part of our continuing mission to  provide you with exceptional heart care, we have created designated Provider Care Teams.  These Care Teams include your primary Cardiologist (physician) and Advanced Practice Providers (APPs -  Physician Assistants and Nurse Practitioners) who all work together to provide you with the care you need, when you need it.  We recommend signing up for the patient portal called "MyChart".  Sign up information is provided on this After Visit Summary.  MyChart is used to connect with patients for Virtual Visits (Telemedicine).  Patients are able to view lab/test results, encounter notes, upcoming appointments, etc.  Non-urgent messages can be sent to your provider as well.   To learn more about what you can do with MyChart, go to [email or web address].    Your next appointment:   1 year(s)  The format for your next appointment:   In Person  Provider:   Lennie Odor, MD    Time Spent with Patient: I have spent a total of 25 minutes with patient reviewing hospital notes, telemetry, EKGs, labs and examining the patient as well as establishing an assessment and plan that was discussed with the patient.  > 50% of time was spent in direct patient care.  Signed, Lenna Gilford. Flora Lipps, MD [facility]  [address], [address] [place], [place] [address] [phone]  06/09/2020 2:26 PM

## 2020-06-09 ENCOUNTER — Other Ambulatory Visit: Payer: Self-pay

## 2020-06-09 ENCOUNTER — Encounter: Payer: Self-pay | Admitting: Cardiovascular Disease

## 2020-06-09 ENCOUNTER — Ambulatory Visit (INDEPENDENT_AMBULATORY_CARE_PROVIDER_SITE_OTHER): Payer: BC Managed Care – PPO | Admitting: Cardiovascular Disease

## 2020-06-09 VITALS — BP 150/90 | HR 78 | Temp 96.4°F | Ht 71.0 in | Wt 241.0 lb

## 2020-06-09 DIAGNOSIS — E782 Mixed hyperlipidemia: Secondary | ICD-10-CM

## 2020-06-09 DIAGNOSIS — Q245 Malformation of coronary vessels: Secondary | ICD-10-CM

## 2020-06-09 DIAGNOSIS — K219 Gastro-esophageal reflux disease without esophagitis: Secondary | ICD-10-CM | POA: Diagnosis not present

## 2020-06-09 NOTE — Patient Instructions (Signed)
Medication Instructions:  No Changes In Medications at this time.  *If you need a refill on your cardiac medications before your next appointment, please call your pharmacy*  Lab Work: None Ordered At This Time.  If you have labs (blood work) drawn today and your tests are completely normal, you will receive your results only by: Marland Kitchen MyChart Message (if you have MyChart) OR . A paper copy in the mail If you have any lab test that is abnormal or we need to change your treatment, we will call you to review the results.  Testing/Procedures: None Ordered At This Time.   Follow-Up: At [facility], you and your health needs are our priority.  As part of our continuing mission to provide you with exceptional heart care, we have created designated Provider Care Teams.  These Care Teams include your primary Cardiologist (physician) and Advanced Practice Providers (APPs -  Physician Assistants and Nurse Practitioners) who all work together to provide you with the care you need, when you need it.  We recommend signing up for the patient portal called "MyChart".  Sign up information is provided on this After Visit Summary.  MyChart is used to connect with patients for Virtual Visits (Telemedicine).  Patients are able to view lab/test results, encounter notes, upcoming appointments, etc.  Non-urgent messages can be sent to your provider as well.   To learn more about what you can do with MyChart, go to [email or web address].    Your next appointment:   1 year(s)  The format for your next appointment:   In Person  Provider:   Lennie Odor, MD

## 2020-06-14 DIAGNOSIS — R109 Unspecified abdominal pain: Secondary | ICD-10-CM | POA: Diagnosis not present

## 2020-06-15 ENCOUNTER — Other Ambulatory Visit: Payer: Self-pay | Admitting: Internal Medicine

## 2020-06-15 ENCOUNTER — Other Ambulatory Visit: Payer: Self-pay | Admitting: Family Medicine

## 2020-06-15 DIAGNOSIS — R109 Unspecified abdominal pain: Secondary | ICD-10-CM

## 2020-07-01 DIAGNOSIS — Z23 Encounter for immunization: Secondary | ICD-10-CM | POA: Diagnosis not present

## 2020-10-31 IMAGING — US US THYROID
1 series · 14 of 25 positions shown · non-contrast
Comparison: None.

CLINICAL DATA: Palpable thyroid on exam.

EXAM:
THYROID ULTRASOUND
TECHNIQUE: Ultrasound examination of the thyroid gland and adjacent soft
tissues was performed.

[Series 1: us thyroid · 0.08mm/px · 14 of 32 slices shown]
[im 1/32]
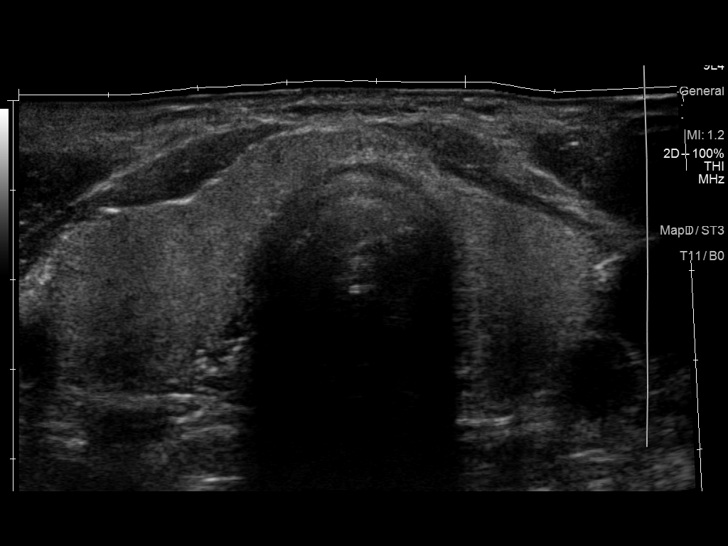
[im 3/32]
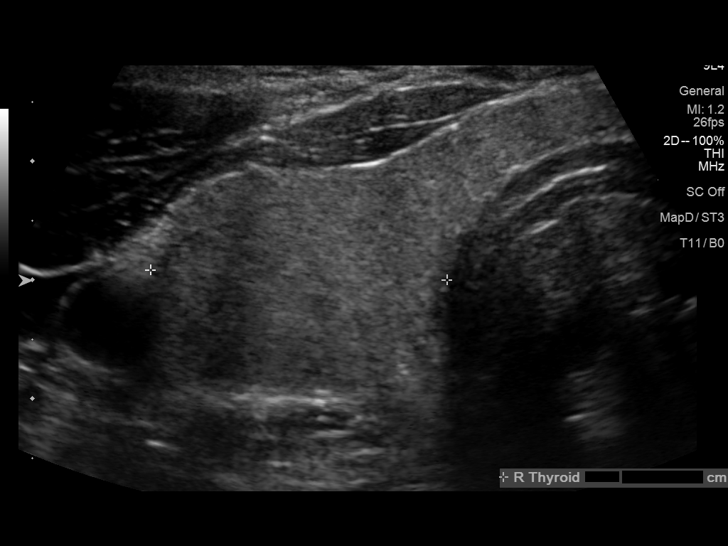
[im 6/32]
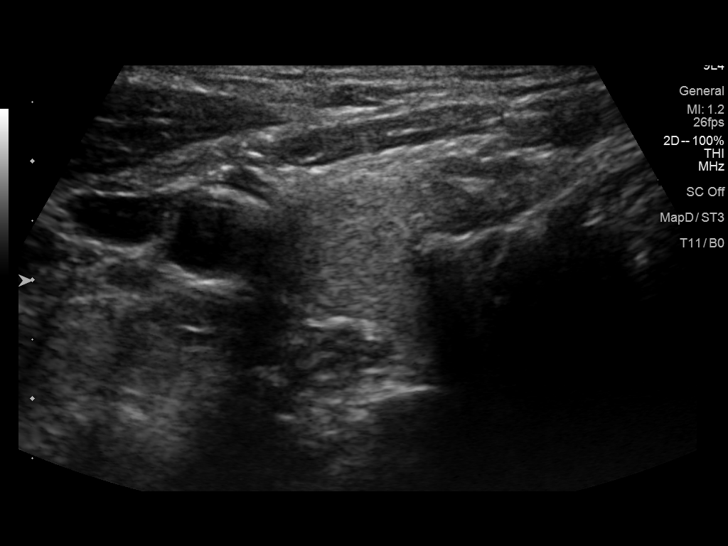
[im 8/32]
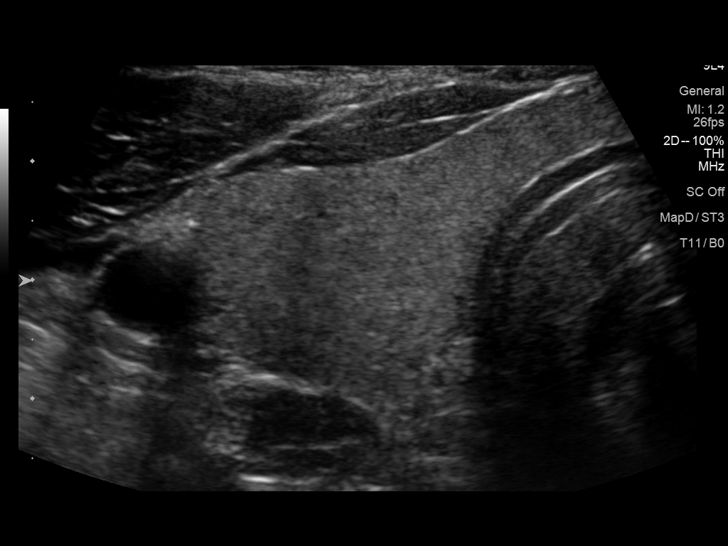
[im 11/32]
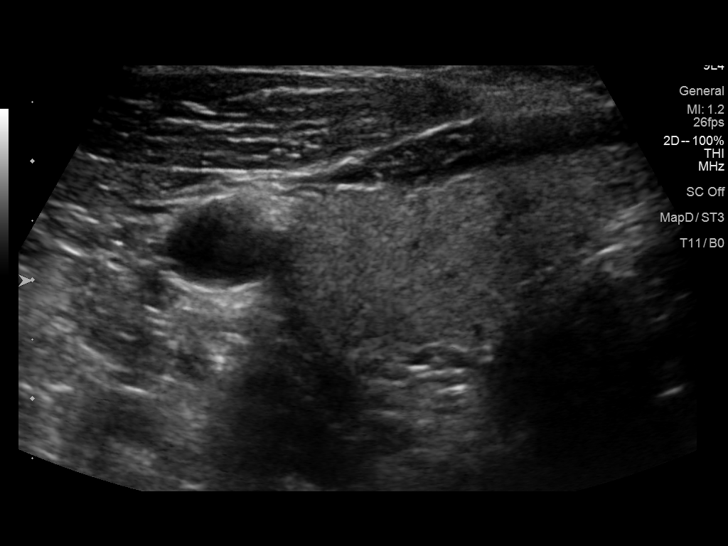
[im 12/32]
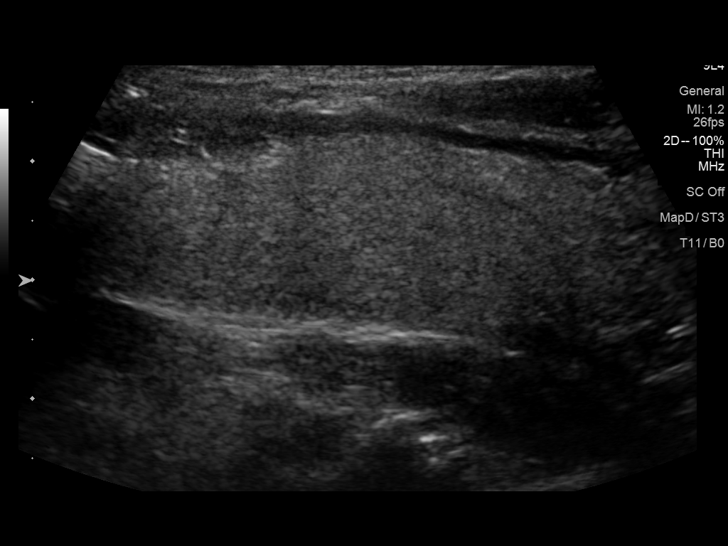
[im 15/32]
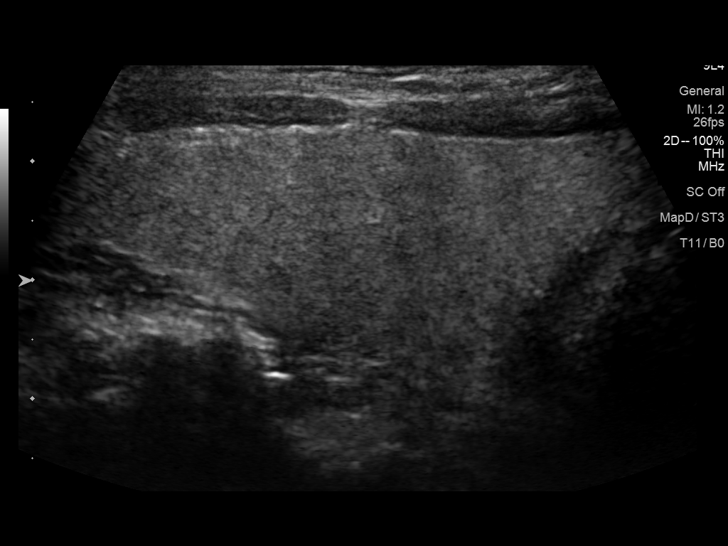
[im 17/32]
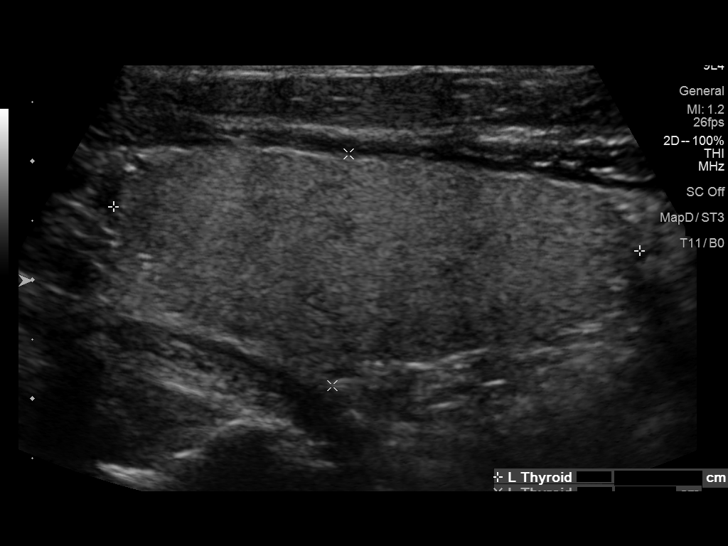
[im 20/32]
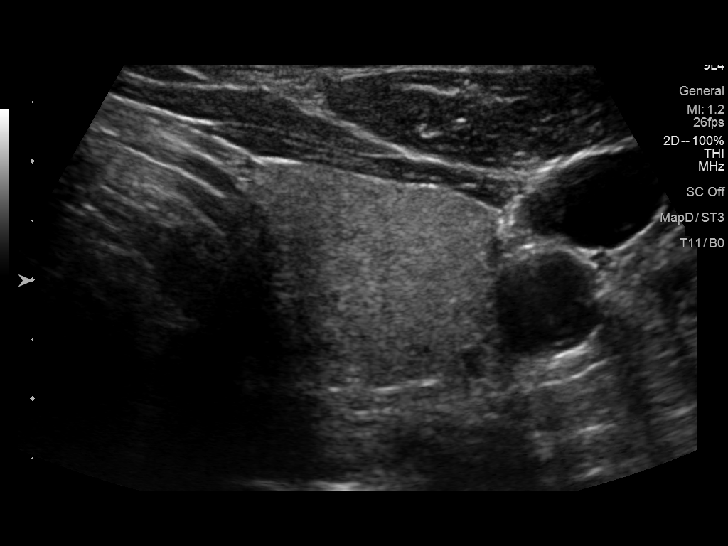
[im 21/32]
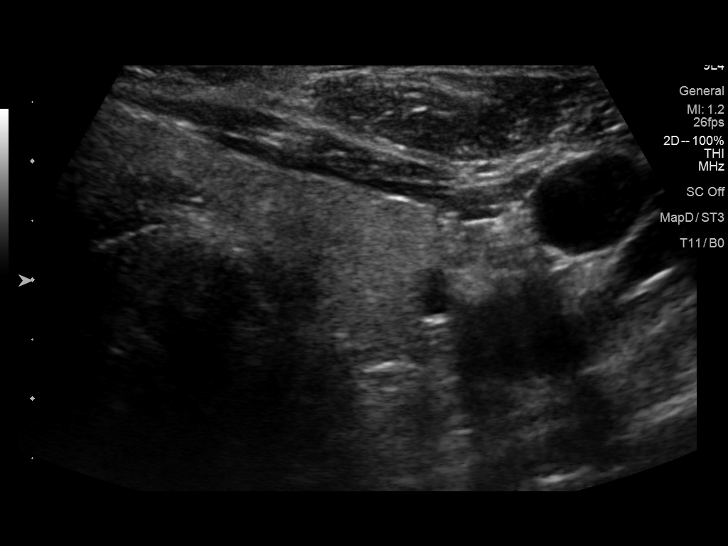
[im 24/32]
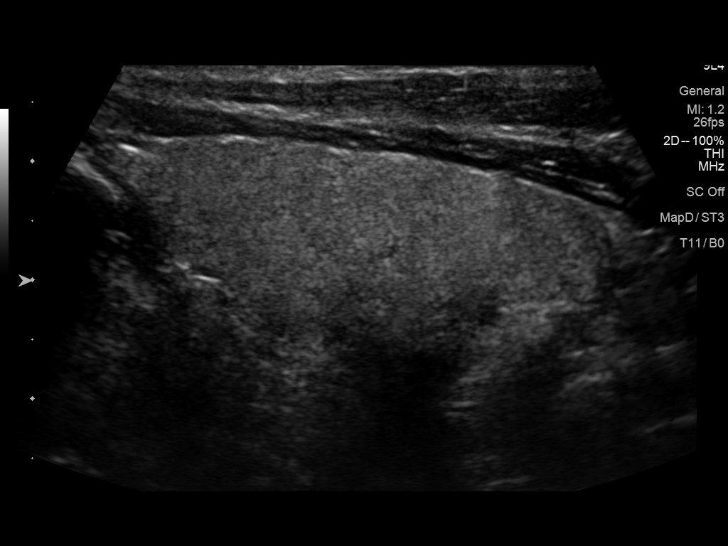
[im 26/32]
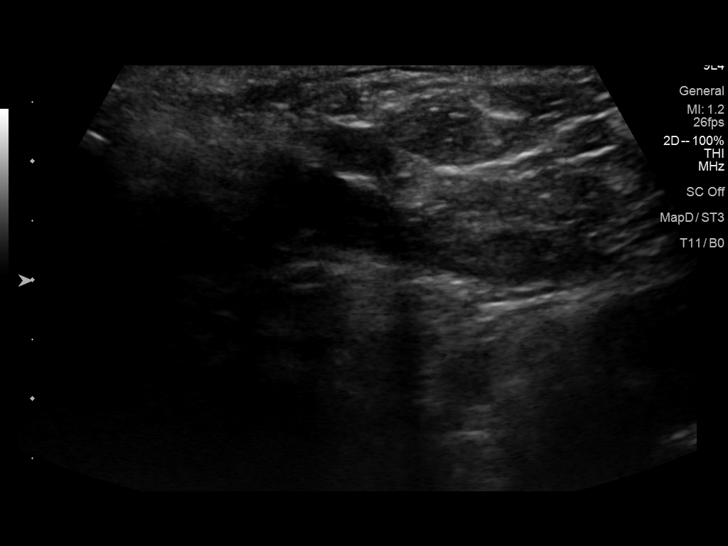
[im 29/32]
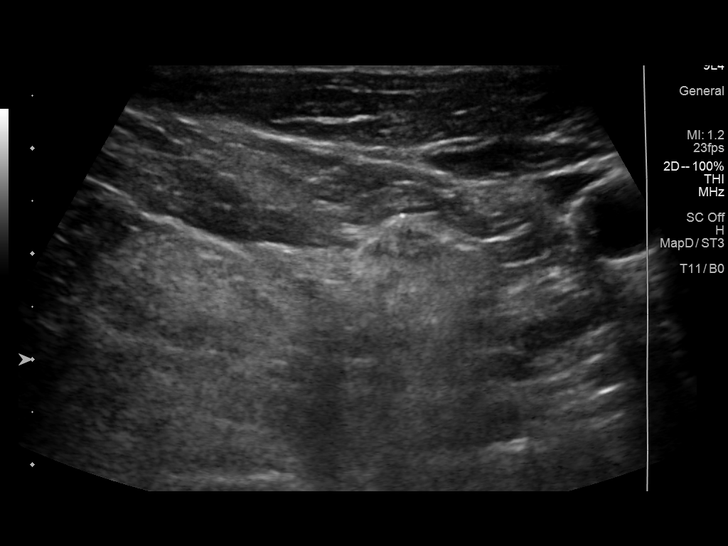
[im 32/32]
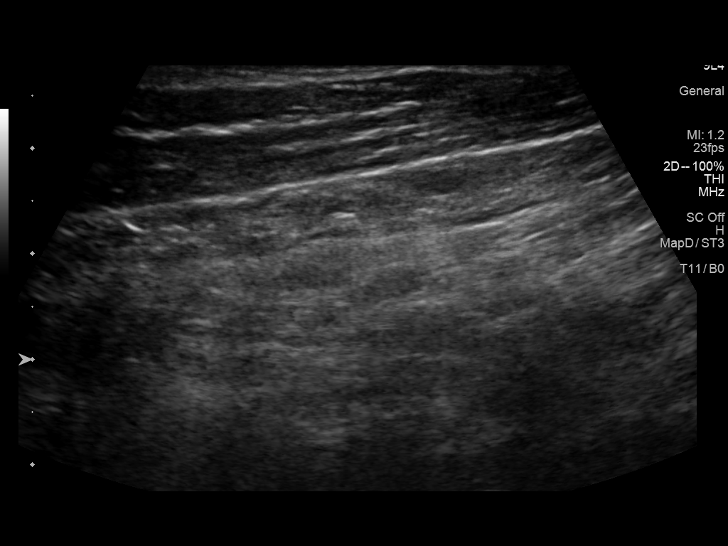

[14 of 25 positions shown; findings below may reference images not displayed]

FINDINGS: Parenchymal Echotexture: Normal

Isthmus: 0.6 cm

Right lobe: 6 x 2.2 x 2.5 cm

Left lobe: 4.5 x 2 x 2 cm

_________________________________________________________

Estimated total number of nodules >/= 1 cm: 0

Number of spongiform nodules >/=  2 cm not described below (TR1): 0

Number of mixed cystic and solid nodules >/= 1.5 cm not described
below (TR2): 0

_________________________________________________________

No discrete nodules are seen within the thyroid gland.
IMPRESSION: Mild asymmetric enlargement of the thyroid gland without evidence
for a distinct thyroid nodule.

The above is in keeping with the ACR TI-RADS recommendations - [HOSPITAL] 7040;[DATE].

## 2020-12-18 IMAGING — CT CT HEART MORP W/ CTA COR W/ SCORE W/ CA W/CM &/OR W/O CM
1 series · 12 of 20 positions shown, 15 images · non-contrast
Comparison: None.
COMPARISON: None.

Addendum:
EXAM:
OVER-READ INTERPRETATION  CT CHEST

The following report is an over-read performed by radiologist Dr.
Yari Ava [REDACTED] on 04/18/2020. This
over-read does not include interpretation of cardiac or coronary
anatomy or pathology. The coronary calcium score/coronary CTA
interpretation by the cardiologist is attached.
CLINICAL DATA: Chest pain
Cardiac/Coronary CTA
TECHNIQUE: The patient was scanned on a Phillips Force scanner. A 100 kV
prospective scan was triggered in the descending thoracic aorta at
111 HU's. Axial non-contrast 3 mm slices were carried out through
the heart. The data set was analyzed on a dedicated work station and
scored using the Agatson method. Gantry rotation speed was 250 msecs
and collimation was .6 mm. 5 mg IV metoprolol and 0.8 mg of sl NTG
was given. The 3D data set was reconstructed in 5% intervals of the
35-75 % of the R-R cycle. Diastolic phases were analyzed on a
dedicated work station using MPR, MIP and VRT modes. The patient
received 80 cc of contrast.

[Series 456: findings · 12 of 22 slices shown, 15 images]
[im 2/22  vessel]
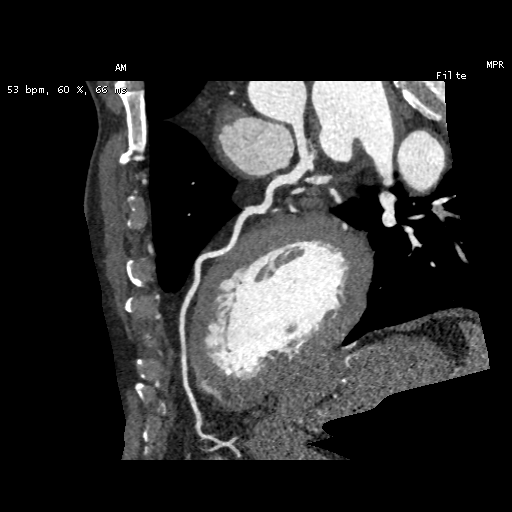
[im 2/22  lung]
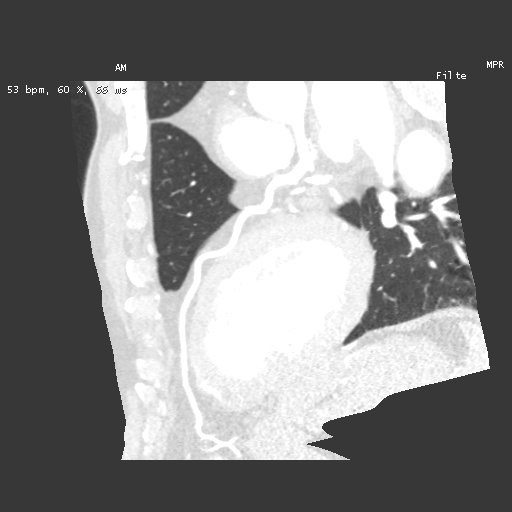
[im 4/22  vessel]
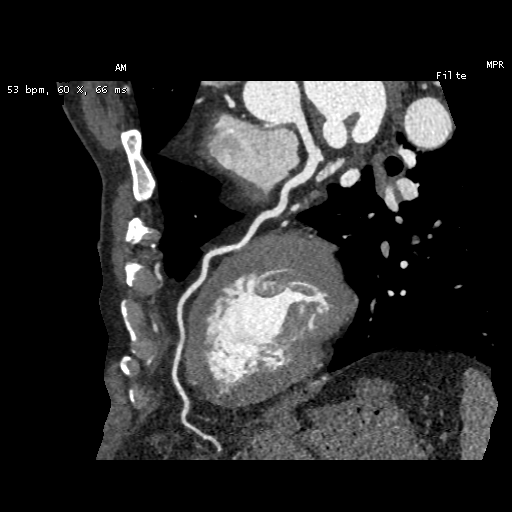
[im 5/22  vessel]
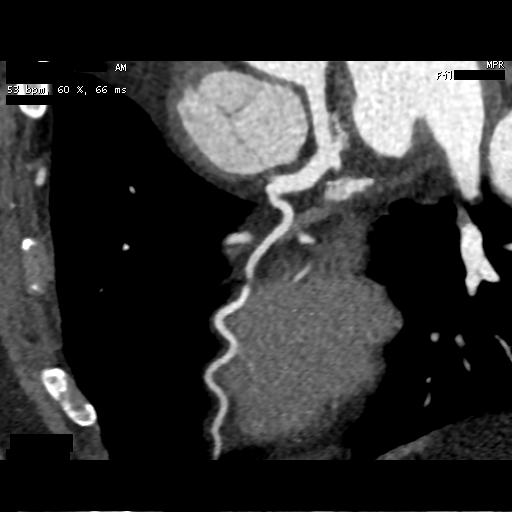
[im 7/22  vessel]
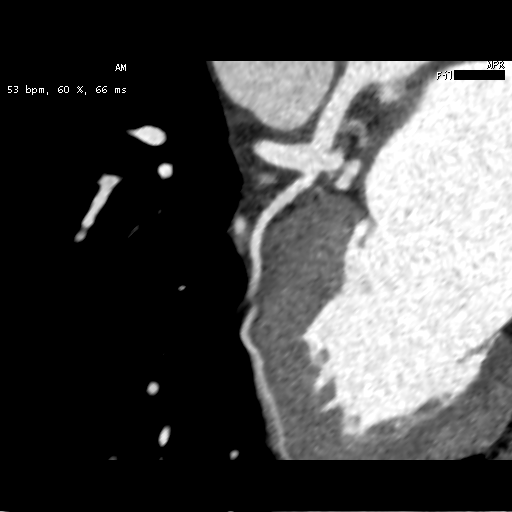
[im 8/22  vessel]
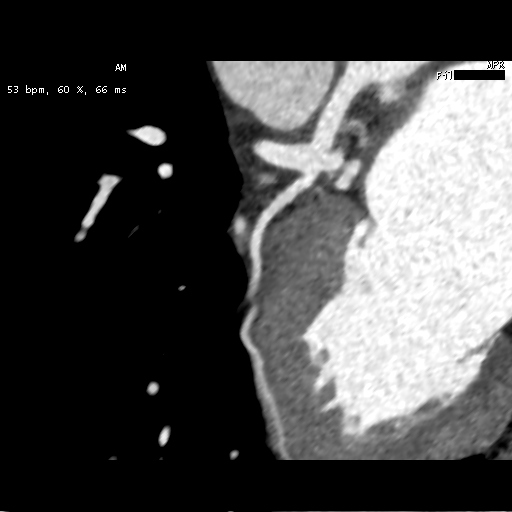
[im 8/22  lung]
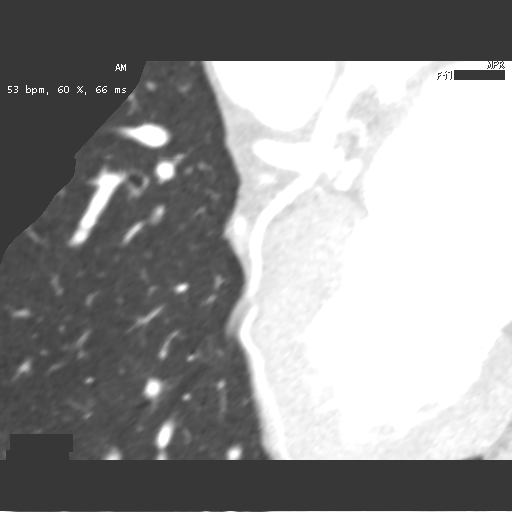
[im 10/22  vessel]
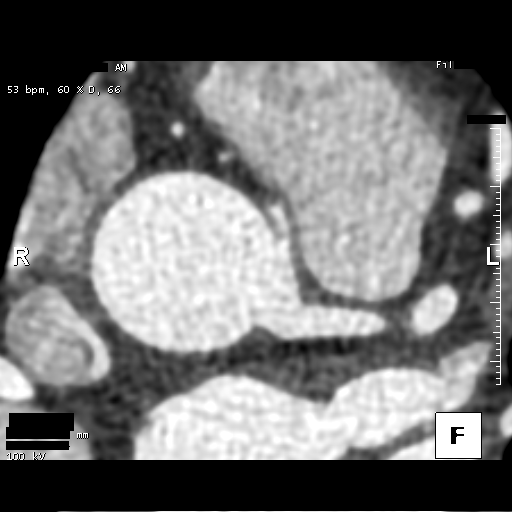
[im 12/22  vessel]
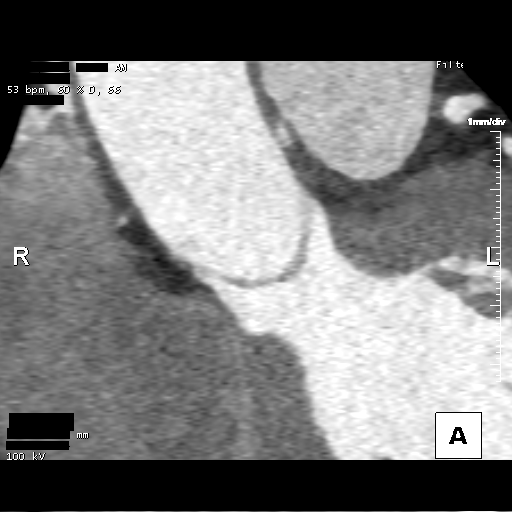
[im 14/22  vessel]
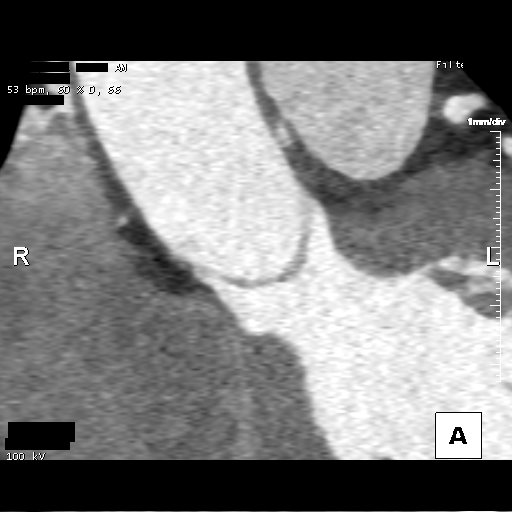
[im 15/22  vessel]
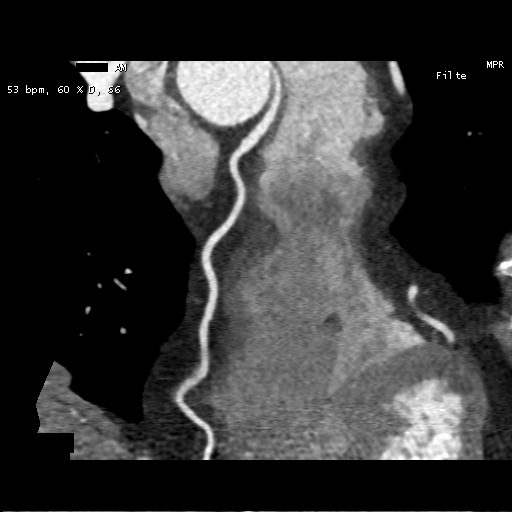
[im 15/22  lung]
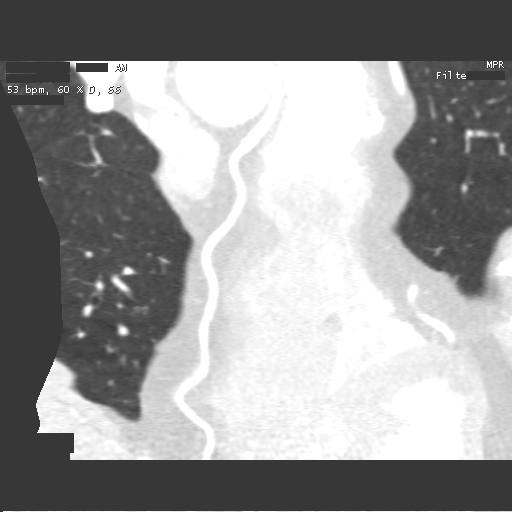
[im 17/22  vessel]
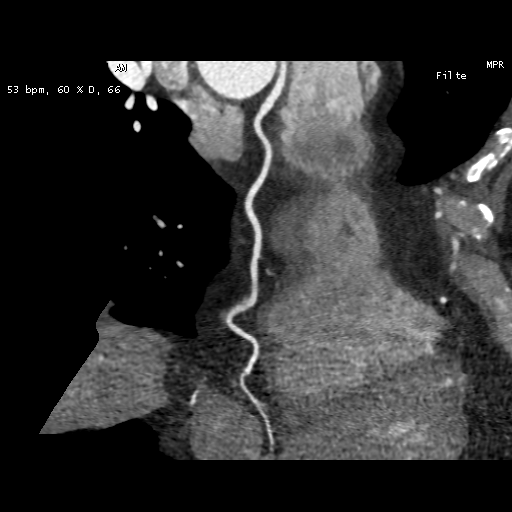
[im 18/22  vessel]
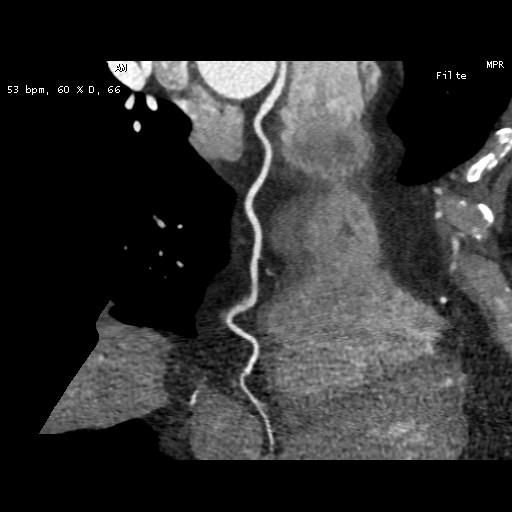
[im 20/22  vessel]
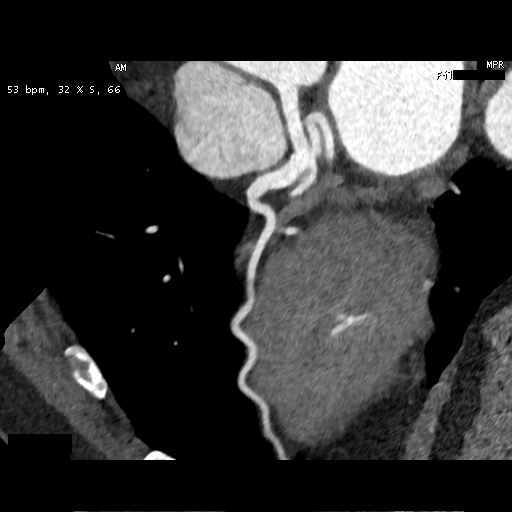

[12 of 20 positions shown; findings below may reference images not displayed]

FINDINGS: 4 mm right upper lobe nodule (axial image 3 of series 12). Within
the visualized portions of the thorax there are no other larger more
suspicious appearing pulmonary nodules or masses, there is no acute
consolidative airspace disease, no pleural effusions, no
pneumothorax and no lymphadenopathy. Visualized portions of the
upper abdomen demonstrates a hypervascular lesion in the superior
aspect of segment 8 of the liver (axial image 58 of series 11)
measuring 1.8 x 1.4 cm. Additional 1.6 x 1.1 cm low-attenuation
lesion in segment 2 of the liver, likely to represent a small cyst.
There are no aggressive appearing lytic or blastic lesions noted in
the visualized portions of the skeleton.
IMPRESSION: 1. Hypervascular lesion in segment 8 of the liver incompletely
characterized on today's examination. This may simply represent a
flash fill cavernous hemangioma, however, further characterization
with nonemergent abdominal MRI with and without IV gadolinium is
strongly recommended in the near future to exclude neoplasm.
2. 4 mm right upper lobe pulmonary nodule, nonspecific, but
statistically likely benign. No follow-up needed if patient is
low-risk. Non-contrast chest CT can be considered in 12 months if
patient is high-risk. This recommendation follows the consensus
statement: Guidelines for Management of Incidental Pulmonary Nodules
Detected on CT Images: From the [HOSPITAL] 5635; Radiology
FINDINGS: Image quality: excellent.

Noise artifact is: Limited.

Coronary Arteries: Anomalous right coronary off the left coronary
cusp with interarterial course between the aorta and pulmonary
artery. Right dominance.

Left main: The left main is a large caliber vessel with a normal
take off from the left coronary cusp that trifurcates into a LAD,
LCX, and ramus intermedius. There is no plaque or stenosis.

Left anterior descending artery: The LAD is patent without evidence
of plaque or stenosis. The LAD gives off 1 large diagonal that
contains significant stair step artifact, however this vessel
appears normal without plaque or stenosis.

Ramus intermedius: There is significant stair step artifact, however
this vessel appears patent without plaque or stenosis.

Left circumflex artery: The LCX is non-dominant and patent with no
evidence of plaque or stenosis. The LCX has a tortuous course. The
LCX gives off 1 patent obtuse marginal branch.

Right coronary artery: The RCA is dominant. There is an anomalous
origin of the RCA off the left coronary cusp with slit-like orifice
and interarterial course at the level of the pulmonary valve. There
is no evidence of plaque or stenosis. The RCA terminates as a PDA
and right posterolateral branch without evidence of plaque or
stenosis.

Right Atrium: Right atrial size is within normal limits.

Right Ventricle: The right ventricular cavity is within normal
limits.

Left Atrium: Left atrial size is normal in size with no left atrial
appendage filling defect. A PFO is present.

Left Ventricle: The ventricular cavity size is within normal limits.
There are no stigmata of prior infarction. There is no abnormal
filling defect.

Pulmonary arteries: Normal in size without proximal filling defect.

Pulmonary veins: Normal pulmonary venous drainage.

Pericardium: Normal thickness with no significant effusion or
calcium present.

Cardiac valves: The aortic valve is trileaflet without significant
calcification. The mitral valve is normal structure without
significant calcification.

Aorta: Normal caliber with no significant disease.

Extra-cardiac findings: See attached radiology report for
non-cardiac structures.
IMPRESSION: 1. Coronary calcium score of 0.

2. Anomalous right coronary artery off the left coronary cusp with
slit-like orifice and interarterial course between the aorta and
pulmonary artery.

3. No evidence of coronary artery disease. Stair step artifact noted
but this appears to be a normal study regarding CAD assessment.

4. Patent foramen ovale.

RECOMMENDATIONS:
1. No evidence of CAD (0%). Consider non-atherosclerotic causes of
chest pain. CT FFR will be sent to the anomalous coronary artery but
due to age of the patient, this is likely of no consequence.

*** End of Addendum ***
EXAM:
OVER-READ INTERPRETATION  CT CHEST

The following report is an over-read performed by radiologist Dr.
Yari Ava [REDACTED] on 04/18/2020. This
over-read does not include interpretation of cardiac or coronary
anatomy or pathology. The coronary calcium score/coronary CTA
interpretation by the cardiologist is attached.
FINDINGS: 4 mm right upper lobe nodule (axial image 3 of series 12). Within
the visualized portions of the thorax there are no other larger more
suspicious appearing pulmonary nodules or masses, there is no acute
consolidative airspace disease, no pleural effusions, no
pneumothorax and no lymphadenopathy. Visualized portions of the
upper abdomen demonstrates a hypervascular lesion in the superior
aspect of segment 8 of the liver (axial image 58 of series 11)
measuring 1.8 x 1.4 cm. Additional 1.6 x 1.1 cm low-attenuation
lesion in segment 2 of the liver, likely to represent a small cyst.
There are no aggressive appearing lytic or blastic lesions noted in
the visualized portions of the skeleton.
IMPRESSION: 1. Hypervascular lesion in segment 8 of the liver incompletely
characterized on today's examination. This may simply represent a
flash fill cavernous hemangioma, however, further characterization
with nonemergent abdominal MRI with and without IV gadolinium is
strongly recommended in the near future to exclude neoplasm.
2. 4 mm right upper lobe pulmonary nodule, nonspecific, but
statistically likely benign. No follow-up needed if patient is
low-risk. Non-contrast chest CT can be considered in 12 months if
patient is high-risk. This recommendation follows the consensus
statement: Guidelines for Management of Incidental Pulmonary Nodules
Detected on CT Images: From the [HOSPITAL] 5635; Radiology

## 2021-01-03 DIAGNOSIS — Z23 Encounter for immunization: Secondary | ICD-10-CM | POA: Diagnosis not present

## 2021-01-03 DIAGNOSIS — Z13220 Encounter for screening for lipoid disorders: Secondary | ICD-10-CM | POA: Diagnosis not present

## 2021-01-03 DIAGNOSIS — Z0000 Encounter for general adult medical examination without abnormal findings: Secondary | ICD-10-CM | POA: Diagnosis not present

## 2021-01-03 DIAGNOSIS — R972 Elevated prostate specific antigen [PSA]: Secondary | ICD-10-CM | POA: Diagnosis not present

## 2021-06-30 DIAGNOSIS — Z23 Encounter for immunization: Secondary | ICD-10-CM | POA: Diagnosis not present

## 2022-01-22 DIAGNOSIS — Z0000 Encounter for general adult medical examination without abnormal findings: Secondary | ICD-10-CM | POA: Diagnosis not present

## 2022-01-22 DIAGNOSIS — E559 Vitamin D deficiency, unspecified: Secondary | ICD-10-CM | POA: Diagnosis not present

## 2022-01-22 DIAGNOSIS — Z125 Encounter for screening for malignant neoplasm of prostate: Secondary | ICD-10-CM | POA: Diagnosis not present

## 2022-01-22 DIAGNOSIS — Z13220 Encounter for screening for lipoid disorders: Secondary | ICD-10-CM | POA: Diagnosis not present

## 2022-05-25 DIAGNOSIS — Z23 Encounter for immunization: Secondary | ICD-10-CM | POA: Diagnosis not present

## 2022-10-03 DIAGNOSIS — K648 Other hemorrhoids: Secondary | ICD-10-CM | POA: Diagnosis not present

## 2023-01-25 DIAGNOSIS — Z125 Encounter for screening for malignant neoplasm of prostate: Secondary | ICD-10-CM | POA: Diagnosis not present

## 2023-01-25 DIAGNOSIS — E559 Vitamin D deficiency, unspecified: Secondary | ICD-10-CM | POA: Diagnosis not present

## 2023-01-25 DIAGNOSIS — Z13220 Encounter for screening for lipoid disorders: Secondary | ICD-10-CM | POA: Diagnosis not present

## 2023-01-25 DIAGNOSIS — Z0000 Encounter for general adult medical examination without abnormal findings: Secondary | ICD-10-CM | POA: Diagnosis not present

## 2023-02-01 DIAGNOSIS — E559 Vitamin D deficiency, unspecified: Secondary | ICD-10-CM | POA: Diagnosis not present

## 2023-02-01 DIAGNOSIS — M545 Low back pain, unspecified: Secondary | ICD-10-CM | POA: Diagnosis not present

## 2023-02-01 DIAGNOSIS — Z0000 Encounter for general adult medical examination without abnormal findings: Secondary | ICD-10-CM | POA: Diagnosis not present

## 2023-02-01 DIAGNOSIS — M6281 Muscle weakness (generalized): Secondary | ICD-10-CM | POA: Diagnosis not present

## 2023-02-01 DIAGNOSIS — R222 Localized swelling, mass and lump, trunk: Secondary | ICD-10-CM | POA: Diagnosis not present

## 2023-05-03 ENCOUNTER — Ambulatory Visit: Payer: BC Managed Care – PPO | Admitting: Diagnostic Neuroimaging

## 2023-05-24 DIAGNOSIS — Z23 Encounter for immunization: Secondary | ICD-10-CM | POA: Diagnosis not present

## 2024-02-06 DIAGNOSIS — E559 Vitamin D deficiency, unspecified: Secondary | ICD-10-CM | POA: Diagnosis not present

## 2024-02-06 DIAGNOSIS — Z0000 Encounter for general adult medical examination without abnormal findings: Secondary | ICD-10-CM | POA: Diagnosis not present

## 2024-02-13 DIAGNOSIS — Z0000 Encounter for general adult medical examination without abnormal findings: Secondary | ICD-10-CM | POA: Diagnosis not present

## 2024-04-20 DIAGNOSIS — K5730 Diverticulosis of large intestine without perforation or abscess without bleeding: Secondary | ICD-10-CM | POA: Diagnosis not present

## 2024-04-20 DIAGNOSIS — Z1211 Encounter for screening for malignant neoplasm of colon: Secondary | ICD-10-CM | POA: Diagnosis not present

## 2024-04-20 DIAGNOSIS — K635 Polyp of colon: Secondary | ICD-10-CM | POA: Diagnosis not present

## 2024-05-22 DIAGNOSIS — Z23 Encounter for immunization: Secondary | ICD-10-CM | POA: Diagnosis not present

## 2024-08-03 DIAGNOSIS — D485 Neoplasm of uncertain behavior of skin: Secondary | ICD-10-CM | POA: Diagnosis not present

## 2024-08-03 DIAGNOSIS — D1723 Benign lipomatous neoplasm of skin and subcutaneous tissue of right leg: Secondary | ICD-10-CM | POA: Diagnosis not present

## 2024-08-03 DIAGNOSIS — L918 Other hypertrophic disorders of the skin: Secondary | ICD-10-CM | POA: Diagnosis not present
